# Patient Record
Sex: Male | Born: 1980 | Race: White | Hispanic: No | Marital: Married | Smoking: Never smoker
Health system: Southern US, Community
[De-identification: ages and names within clinical notes are randomized; demographics above are authoritative.]

## PROBLEM LIST (undated history)

## (undated) DIAGNOSIS — I10 Essential (primary) hypertension: Secondary | ICD-10-CM

## (undated) DIAGNOSIS — E785 Hyperlipidemia, unspecified: Secondary | ICD-10-CM

## (undated) DIAGNOSIS — F329 Major depressive disorder, single episode, unspecified: Secondary | ICD-10-CM

## (undated) DIAGNOSIS — F32A Depression, unspecified: Secondary | ICD-10-CM

## (undated) HISTORY — DX: Depression, unspecified: F32.A

## (undated) HISTORY — PX: TONSILLECTOMY: SUR1361

## (undated) HISTORY — DX: Major depressive disorder, single episode, unspecified: F32.9

---

## 2001-09-11 ENCOUNTER — Emergency Department (HOSPITAL_COMMUNITY): Admission: EM | Admit: 2001-09-11 | Discharge: 2001-09-11 | Payer: Self-pay | Admitting: Emergency Medicine

## 2003-10-04 ENCOUNTER — Emergency Department (HOSPITAL_COMMUNITY): Admission: EM | Admit: 2003-10-04 | Discharge: 2003-10-04 | Payer: Self-pay | Admitting: Emergency Medicine

## 2004-01-13 ENCOUNTER — Emergency Department (HOSPITAL_COMMUNITY): Admission: EM | Admit: 2004-01-13 | Discharge: 2004-01-13 | Payer: Self-pay

## 2004-01-15 ENCOUNTER — Emergency Department (HOSPITAL_COMMUNITY): Admission: EM | Admit: 2004-01-15 | Discharge: 2004-01-15 | Payer: Self-pay | Admitting: Emergency Medicine

## 2006-01-13 ENCOUNTER — Emergency Department (HOSPITAL_COMMUNITY): Admission: EM | Admit: 2006-01-13 | Discharge: 2006-01-13 | Payer: Self-pay | Admitting: Family Medicine

## 2006-07-05 ENCOUNTER — Ambulatory Visit (HOSPITAL_BASED_OUTPATIENT_CLINIC_OR_DEPARTMENT_OTHER): Admission: RE | Admit: 2006-07-05 | Discharge: 2006-07-05 | Payer: Self-pay | Admitting: Family Medicine

## 2006-07-12 ENCOUNTER — Ambulatory Visit: Payer: Self-pay | Admitting: Internal Medicine

## 2007-02-14 ENCOUNTER — Emergency Department (HOSPITAL_COMMUNITY): Admission: EM | Admit: 2007-02-14 | Discharge: 2007-02-14 | Payer: Self-pay | Admitting: Emergency Medicine
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
## 2011-01-03 NOTE — Procedures (Signed)
NAME:  Devon Collins, CAMILLI NO.:  [id number]   MEDICAL RECORD NO.:  [id number]          PATIENT TYPE:  OUT   LOCATION:  SLEEP CENTER                 FACILITY:  [facility]   PHYSICIAN:  Clinton D. Maple Hudson, MD, FCCP, FACPDATE OF BIRTH:  12/09/1979   DATE OF STUDY:  07/05/2006                              NOCTURNAL POLYSOMNOGRAM   REFERRING PHYSICIAN:  Dr. Herb Grays   INDICATION FOR STUDY:  Hypersomnia with sleep apnea.   EPWORTH SLEEPINESS SCORE:  18/24. BMI 31, weight 218 pounds.   MEDICATIONS:  Lisinopril, fluvoxamine, Lamictal.   SLEEP ARCHITECTURE:  Total sleep time 341 minutes with sleep efficiency 76%.  Stage I was 8%, stage II 68%, stages III and IV 13%. REM 10% of total sleep  time. Sleep latency 105 minutes. REM latency 310 minutes. Awake after sleep  onset 15 minutes. Arousal index 16. No bedtime medication was taken.   RESPIRATORY DATA:  Apnea-hypopnea index (AHI, RDI) 3.7 obstructive events  per hour which is within normal limits (normal adult range 0-5 per hour).  There were 6 obstructive apneas and 15 hypopneas. Events were strongly  positional, mostly associated with the supine sleep position. REM AHI  16.9/hour. there were insufficient events to permit use of CPAP titration by  split protocol on this study night.   OXYGEN DATA:  Mild to moderate snoring and mouth-breathing with oxygen  desaturation to a nadir of 83%. Mean oxygen saturation through the study was  97% on room air.   CARDIAC DATA:  Normal sinus rhythm.   MOVEMENT-PARASOMNIA:  A total of 109 limb jerks were recorded of which 39  were associated with arousal or awakening for a period limb movement with  arousal index of 6.9/hour, which is increased.   IMPRESSIONS-RECOMMENDATIONS:  1. Sleep architecture significant mainly for delayed onset of sustained      sleep at around midnight. Percentage of time spent in rapid eye      movement (REM) was reduced but this is nonspecific.  2.  Occasional sleep disordered breathing events, within normal limits,      apnea-hypopnea index 3.7 per hour (normal range 0-5 per hour). Events      were positional, suggesting that the patient could be encouraged to      sleep off flat of her back as a way to reduce snoring and sleep-related      breathing complaints. There was mild to      moderate snoring with oxygen desaturation to a nadir of 83%.  3. Periodic limb movement with arousal, 6.9 per hour.      Clinton D. Maple Hudson, MD, [facility], FACP  Diplomate, Biomedical engineer of Sleep Medicine  Electronically Signed     CDY/MEDQ  D:  07/12/2006 16:25:37  T:  07/12/2006 16:52:24  Job:  [id number]

## 2012-12-20 ENCOUNTER — Encounter (HOSPITAL_COMMUNITY): Payer: Self-pay | Admitting: *Deleted

## 2012-12-20 ENCOUNTER — Emergency Department (INDEPENDENT_AMBULATORY_CARE_PROVIDER_SITE_OTHER)
Admission: EM | Admit: 2012-12-20 | Discharge: 2012-12-20 | Disposition: A | Discharge status: Home / Self Care | Payer: Worker's Compensation | Source: Home / Self Care

## 2012-12-20 DIAGNOSIS — T22119A Burn of first degree of unspecified forearm, initial encounter: Secondary | ICD-10-CM

## 2012-12-20 DIAGNOSIS — T22219A Burn of second degree of unspecified forearm, initial encounter: Secondary | ICD-10-CM

## 2012-12-20 MED ORDER — IBUPROFEN 800 MG PO TABS
ORAL_TABLET | ORAL | Status: AC
  Filled 2012-12-20: qty 1

## 2012-12-20 MED ORDER — IBUPROFEN 800 MG PO TABS
800.0000 mg | ORAL_TABLET | Freq: Once | ORAL | Status: AC
  Administered 2012-12-20: 800 mg via ORAL

## 2012-12-20 MED ORDER — SILVER SULFADIAZINE 1 % EX CREA: TOPICAL_CREAM | Freq: Every day | CUTANEOUS | Status: DC

## 2012-12-20 MED ORDER — SILVER SULFADIAZINE 1 % EX CREA
TOPICAL_CREAM | Freq: Once | CUTANEOUS | Status: AC
  Administered 2012-12-20: 20:00:00 via TOPICAL

## 2012-12-20 MED ORDER — IBUPROFEN 800 MG PO TABS: 800.0000 mg | ORAL_TABLET | Freq: Three times a day (TID) | ORAL | Status: DC
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
## 2012-12-20 NOTE — Discharge Instructions (Signed)
Burn Care Your skin is a natural barrier to infection. It is the largest organ of your body. Burns damage this natural protection. To help prevent infection, it is very important to follow your caregiver's instructions in the care of your burn. Burns are classified as:  First degree. There is only redness of the skin (erythema). No scarring is expected.  Second degree. There is blistering of the skin. Scarring may occur with deeper burns.  Third degree. All layers of the skin are injured, and scarring is expected. HOME CARE INSTRUCTIONS   Wash your hands well before changing your bandage.  Change your bandage as often as directed by your caregiver.  Remove the old bandage. If the bandage sticks, you may soak it off with cool, clean water.  Cleanse the burn thoroughly but gently with mild soap and water.  Pat the area dry with a clean, dry cloth.  Apply a thin layer of antibacterial cream to the burn.  Apply a clean bandage as instructed by your caregiver.  Keep the bandage as clean and dry as possible.  Elevate the affected area for the first 24 hours, then as instructed by your caregiver.  Only take over-the-counter or prescription medicines for pain, discomfort, or fever as directed by your caregiver. SEEK IMMEDIATE MEDICAL CARE IF:   You develop excessive pain.  You develop redness, tenderness, swelling, or red streaks near the burn.  The burned area develops yellowish-white fluid (pus) or a bad smell.  You have a fever. MAKE SURE YOU:   Understand these instructions.  Will watch your condition.  Will get help right away if you are not doing well or get worse. Document Released: 08/04/2005 Document Revised: 10/27/2011 Document Reviewed: 12/25/2010 [facility] Patient Information 2013 [place], [place].  Second-Degree Burn A second-degree burn affects the 2 outer layers of skin. The outer layer (epidermis) and the layer underneath it (dermis) are both burned. Another  name for this type of burn is a partial thickness burn. A second-degree burn may be called minor or major. This depends on the size of the burn. It also depends on what parts of the skin are burned. Minor burns may be treated with first aid. Major burns are a medical emergency. A second-degree burn is worse than a first-degree burn, but not as bad as a third-degree burn. A first-degree burn affects only the epidermis. A third-degree burn goes through all the layers of skin. A second-degree burn usually heals in 3 to 4 weeks. A minor second-degree burn usually does not leave a scar.Deeper second-degree burns may lead to scarring of the skin or contractures over joints.Contractures are scars that form over joints and may lead to reduced mobility at those joints. CAUSES  Heat (thermal) injury. This happens when skin comes in contact with something very hot. It could be a flame, a hot object, hot liquid, or steam. Most second-degree burns are thermal injuries.  Radiation. Sunlight is one type of radiation that can burn the skin. Another type of radiation is used to heat food. Radiation is also used to treat some diseases, such as cancer. All types of radiation can burn the skin. Sunlight usually causes a first-degree burn. Radiation used for heating food or treating a disease can cause a second-degree burn.  Electricity. Electrical burns can cause more damage under the skin than on the surface. They should always be treated as major burns.  Chemicals. Many chemicals can burn the skin. The burn should be flushed with cool water and checked  by an emergency caregiver. SYMPTOMS Symptoms of second-degree burns include:  Severe pain.  Extreme tenderness.  Deep redness.  Blistered skin.  Skin that has changed color.It might look blotchy, wet, or shiny.  Swelling. TREATMENT Some second-degree burns may need to be treated in a hospital. These include major burns, electrical burns, and chemical burns.  Many other second-degree burns can be treated with regular first aid, such as:  Cooling the burn. Use cool, germ-free (sterile) salt water. Place the burned area of skin into a tub of water, or cover the burned area with clean, wet towels.  Taking pain medicine.  Removing the dead skin from broken blisters. A trained caregiver may do this. Do not pop blisters.  Gently washing your skin with mild soap.  Covering the burned area with a cream.Silver sulfadiazine is a cream for burns. An antibiotic cream, such as bacitracin, may also be used to fight infection. Do not use other ointments or creams unless your caregiver says it is okay.  Protecting the burn with a sterile, non-sticky bandage.  Bandaging fingers and toes separately. This keeps them from sticking together.  Taking an antibiotic. This can help prevent infection.  Getting a tetanus shot. HOME CARE INSTRUCTIONS Medication  Take any medicine prescribed by your caregiver. Follow the directions carefully.  Ask your caregiver if you can take over-the-counter medicine to relieve pain and swelling. Do not give aspirin to children.  Make sure your caregiver knows about all other medicines you take.This includes over-the-counter medicines. Burn care  You will need to change the bandage on your burn. You may need to do this 2 or 3 times each day.  Gently clean the burned area.  Put ointment on it.  Cover the burn with a sterile bandage.  For some deeper burns or burns that cover a large area, compression garments may be prescribed. These garments can help minimize scarring and protect your mobility.  Do not put butter or oil on your skin. Use only the cream prescribed by your caregiver.  Do not put ice on your burn.  Do not break blisters on your skin.  Keep the bandaged area dry. You might need to take a sponge bath for awhile.Ask your caregiver when you can take a shower or a tub bath again.  Do not scratch an itchy  burn. Your caregiver may give you medicine to relieve very bad itching.  Infection is a big danger after a second-degree burn. Tell your caregiver right away if you have signs of infection, such as:  Redness or changing color in the burned area.  Fluid leaking from the burn.  Swelling in the burn area.  A bad smell coming from the wound. Follow-up  Keep all follow-up appointments.This is important. This is how your caregiver can tell if your treatment is working.  Protect your burn from sunlight.Use sunscreen whenever you go outside.Burned areas may be sensitive to the sun for up to 1 year. Exposure to the sun may also cause permanent darkening of scars. SEEK MEDICAL CARE IF:  You have any questions about medicines.  You have any questions about your treatment.  You wonder if it is okay to do a particular activity.  You develop a fever of more than 100.5 F (38.1 C). SEEK IMMEDIATE MEDICAL CARE IF:  You think your burn might be infected. It may change color, become red, leak fluid, swell, or smell bad.  You develop a fever of more than 102 F (38.9 C). Document Released:  01/06/2011 Document Revised: 10/27/2011 Document Reviewed: 01/06/2011 [facility] Patient Information 2013 [place], [place].
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
## 2012-12-24 NOTE — ED Provider Notes (Signed)
Medical screening examination/treatment/procedure(s) were performed by resident physician or non-physician practitioner and as supervising physician I was immediately available for consultation/collaboration.   Sherly Brodbeck DOUGLAS MD.   Nevena Rozenberg D Kaire Stary, MD 12/24/12 1804 

## 2014-03-21 ENCOUNTER — Other Ambulatory Visit: Payer: Self-pay | Admitting: Occupational Medicine

## 2014-03-21 ENCOUNTER — Ambulatory Visit
Admission: RE | Admit: 2014-03-21 | Discharge: 2014-03-21 | Disposition: A | Discharge status: Home / Self Care | Payer: No Typology Code available for payment source | Source: Ambulatory Visit | Attending: Occupational Medicine | Admitting: Occupational Medicine

## 2014-03-21 DIAGNOSIS — Z0000 Encounter for general adult medical examination without abnormal findings: Secondary | ICD-10-CM

## 2015-01-16 ENCOUNTER — Other Ambulatory Visit: Payer: Self-pay | Admitting: Surgery

## 2015-02-28 ENCOUNTER — Ambulatory Visit: Payer: Self-pay | Admitting: Family

## 2015-02-28 ENCOUNTER — Encounter: Payer: Self-pay | Admitting: Physician Assistant

## 2015-02-28 ENCOUNTER — Ambulatory Visit (INDEPENDENT_AMBULATORY_CARE_PROVIDER_SITE_OTHER): Payer: BLUE CROSS/BLUE SHIELD | Admitting: Physician Assistant

## 2015-02-28 VITALS — BP 154/108 | HR 85 | Temp 97.6°F | Ht 61.0 in | Wt 242.4 lb

## 2015-02-28 DIAGNOSIS — I10 Essential (primary) hypertension: Secondary | ICD-10-CM

## 2015-02-28 MED ORDER — LISINOPRIL-HYDROCHLOROTHIAZIDE 10-12.5 MG PO TABS: 1.0000 | ORAL_TABLET | Freq: Every day | ORAL | Status: DC

## 2015-02-28 NOTE — Progress Notes (Signed)
   Subjective:    Patient ID: Devon Collins, male    DOB: 1981/07/06, 34 y.o.   MRN: [id number]  HPI 34 y/o male presents with c/o elevated BP. He has a h/o htn. Was being treated with Lisinopril/HCTZ , however, his PCP stopped the medication 6 months ago. .     Review of Systems  Constitutional: Negative.   HENT: Negative.   Eyes: Negative for photophobia and visual disturbance.  Respiratory: Negative for shortness of breath.   Cardiovascular: Negative for chest pain, palpitations and leg swelling.  Gastrointestinal: Negative for nausea and vomiting.  Genitourinary: Negative.  Negative for difficulty urinating.  Musculoskeletal: Negative.   Skin: Negative.   Neurological: Negative.   Psychiatric/Behavioral: Negative.        Objective:   Physical Exam  Constitutional: He is oriented to person, place, and time. He appears well-developed and well-nourished. No distress.  HENT:  Head: Normocephalic.  Cardiovascular: Normal rate, regular rhythm, normal heart sounds and intact distal pulses.  Exam reveals no gallop and no friction rub.   No murmur heard. Hypertensive   Pulmonary/Chest: Effort normal and breath sounds normal. No respiratory distress. He has no wheezes. He has no rales. He exhibits no tenderness.  Neurological: He is alert and oriented to person, place, and time.  Skin: He is not diaphoretic.  Psychiatric: He has a normal mood and affect. His behavior is normal. Judgment and thought content normal.  Nursing note and vitals reviewed.         Assessment & Plan:  1. Essential hypertension  - lisinopril-hydrochlorothiazide (PRINZIDE,ZESTORETIC) 10-12.5 MG per tablet; Take 1 tablet by mouth daily.  Dispense: 30 tablet; Refill: 0   Follow up in 2 weeks for recheck of htn, in addition to fasting labs. Chart given to patient for at home bp readings   Devon Collins A. Chauncey ReadingGann PA-C

## 2015-03-16 ENCOUNTER — Ambulatory Visit: Payer: BLUE CROSS/BLUE SHIELD | Admitting: Physician Assistant

## 2015-03-21 ENCOUNTER — Ambulatory Visit: Payer: BLUE CROSS/BLUE SHIELD | Admitting: Physician Assistant

## 2015-03-22 ENCOUNTER — Other Ambulatory Visit: Payer: Self-pay | Admitting: Physician Assistant

## 2015-03-22 DIAGNOSIS — I10 Essential (primary) hypertension: Secondary | ICD-10-CM

## 2015-03-22 MED ORDER — LISINOPRIL-HYDROCHLOROTHIAZIDE 10-12.5 MG PO TABS: 1.0000 | ORAL_TABLET | Freq: Every day | ORAL | Status: DC

## 2015-04-18 ENCOUNTER — Ambulatory Visit (INDEPENDENT_AMBULATORY_CARE_PROVIDER_SITE_OTHER): Payer: 59 | Admitting: Family

## 2015-04-18 ENCOUNTER — Encounter: Payer: Self-pay | Admitting: Family

## 2015-04-18 VITALS — BP 131/87 | HR 96 | Temp 97.3°F | Ht 70.0 in | Wt 231.0 lb

## 2015-04-18 DIAGNOSIS — Z0000 Encounter for general adult medical examination without abnormal findings: Secondary | ICD-10-CM | POA: Diagnosis not present

## 2015-04-18 DIAGNOSIS — F32A Depression, unspecified: Secondary | ICD-10-CM | POA: Insufficient documentation

## 2015-04-18 DIAGNOSIS — I10 Essential (primary) hypertension: Secondary | ICD-10-CM | POA: Diagnosis not present

## 2015-04-18 DIAGNOSIS — F329 Major depressive disorder, single episode, unspecified: Secondary | ICD-10-CM | POA: Diagnosis not present

## 2015-04-18 MED ORDER — DESVENLAFAXINE SUCCINATE ER 100 MG PO TB24: 100.0000 mg | ORAL_TABLET | Freq: Every day | ORAL | Status: DC

## 2015-04-18 MED ORDER — LISINOPRIL-HYDROCHLOROTHIAZIDE 10-12.5 MG PO TABS: 1.0000 | ORAL_TABLET | Freq: Every day | ORAL | Status: DC

## 2015-04-18 NOTE — Patient Instructions (Signed)

## 2015-04-18 NOTE — Progress Notes (Signed)
Subjective:    Patient ID: Devon Collins, male    DOB: [birth year], 34 y.o.   MRN: [id number]  Pt presents to the office today for CPE and lab work. PT states he is fasting.  Hypertension This is a chronic problem. The current episode started more than 1 year ago. The problem has been resolved since onset. The problem is controlled. Pertinent negatives include no anxiety, headaches, palpitations, peripheral edema or shortness of breath. Risk factors for coronary artery disease include obesity and male gender. Past treatments include ACE inhibitors and diuretics. The current treatment provides significant improvement. There is no history of kidney disease, CAD/MI, CVA, heart failure or a thyroid problem. There is no history of sleep apnea.  Depression        This is a chronic problem.  The current episode started more than 1 year ago.   The onset quality is sudden.   The problem occurs constantly.  The problem has been waxing and waning since onset.  Associated symptoms include no fatigue, no hopelessness, no restlessness, no headaches and not sad.  Compliance with treatment is good.  Previous treatment provided significant relief.   Pertinent negatives include no thyroid problem and no anxiety.     Review of Systems  Constitutional: Negative.  Negative for fatigue.  HENT: Negative.   Respiratory: Negative.  Negative for shortness of breath.   Cardiovascular: Negative.  Negative for palpitations.  Gastrointestinal: Negative.   Endocrine: Negative.   Genitourinary: Negative.   Musculoskeletal: Negative.   Neurological: Negative.  Negative for headaches.  Hematological: Negative.   Psychiatric/Behavioral: Positive for depression.  All other systems reviewed and are negative.      Objective:   Physical Exam  Constitutional: He is oriented to person, place, and time. He appears well-developed and well-nourished. No distress.  HENT:  Head: Normocephalic.  Right Ear: External ear  normal.  Left Ear: External ear normal.  Nose: Nose normal.  Mouth/Throat: Oropharynx is clear and moist.  Eyes: Pupils are equal, round, and reactive to light. Right eye exhibits no discharge. Left eye exhibits no discharge.  Neck: Normal range of motion. Neck supple. No thyromegaly present.  Cardiovascular: Normal rate, regular rhythm, normal heart sounds and intact distal pulses.   No murmur heard. Pulmonary/Chest: Effort normal and breath sounds normal. No respiratory distress. He has no wheezes.  Abdominal: Soft. Bowel sounds are normal. He exhibits no distension. There is no tenderness.  Musculoskeletal: Normal range of motion. He exhibits no edema or tenderness.  Neurological: He is alert and oriented to person, place, and time. He has normal reflexes. No cranial nerve deficit.  Skin: Skin is warm and dry. No rash noted. No erythema.  Psychiatric: He has a normal mood and affect. His behavior is normal. Judgment and thought content normal.  Vitals reviewed.   BP 131/87 mmHg  Pulse 96  Temp(Src) 97.3 F (36.3 C) (Oral)  Ht 5' 10"  (1.778 m)  Wt 231 lb (104.781 kg)  BMI 33.15 kg/m2       Assessment & Plan:  1. Essential hypertension - CMP14+EGFR - lisinopril-hydrochlorothiazide (PRINZIDE,ZESTORETIC) 10-12.5 MG per tablet; Take 1 tablet by mouth daily.  Dispense: 90 tablet; Refill: 3  2. Depression - CMP14+EGFR - desvenlafaxine (PRISTIQ) 100 MG 24 hr tablet; Take 1 tablet (100 mg total) by mouth daily.  Dispense: 90 tablet; Refill: 3  3. Annual physical exam - CMP14+EGFR - Lipid panel - Thyroid Panel With TSH - Vit D  25 hydroxy (rtn osteoporosis  monitoring) - CBC with Differential/Platelet   Continue all meds Labs pending Health Maintenance reviewed Diet and exercise encouraged RTO 1 year   Evelina Dun, FNP

## 2015-04-19 ENCOUNTER — Other Ambulatory Visit: Payer: Self-pay | Admitting: Family

## 2015-04-19 DIAGNOSIS — E785 Hyperlipidemia, unspecified: Secondary | ICD-10-CM | POA: Insufficient documentation

## 2015-04-19 LAB — CMP14+EGFR
ALBUMIN: 4.9 g/dL (ref 3.5–5.5)
ALT: 24 IU/L (ref 0–44)
AST: 19 IU/L (ref 0–40)
Albumin/Globulin Ratio: 2 (ref 1.1–2.5)
Alkaline Phosphatase: 71 IU/L (ref 39–117)
BUN / CREAT RATIO: 17 (ref 8–19)
BUN: 19 mg/dL (ref 6–20)
Bilirubin Total: 0.7 mg/dL (ref 0.0–1.2)
CALCIUM: 9.7 mg/dL (ref 8.7–10.2)
CO2: 25 mmol/L (ref 18–29)
CREATININE: 1.11 mg/dL (ref 0.76–1.27)
Chloride: 94 mmol/L — ABNORMAL LOW (ref 97–108)
GFR, EST AFRICAN AMERICAN: 99 mL/min/{1.73_m2} (ref >59)
GFR, EST NON AFRICAN AMERICAN: 86 mL/min/{1.73_m2} (ref >59)
GLOBULIN, TOTAL: 2.4 g/dL (ref 1.5–4.5)
Glucose: 87 mg/dL (ref 65–99)
Potassium: 4.3 mmol/L (ref 3.5–5.2)
SODIUM: 138 mmol/L (ref 134–144)
TOTAL PROTEIN: 7.3 g/dL (ref 6.0–8.5)

## 2015-04-19 LAB — LIPID PANEL
CHOL/HDL RATIO: 5.3 ratio — AB (ref 0.0–5.0)
Cholesterol, Total: 196 mg/dL (ref 100–199)
HDL: 37 mg/dL — ABNORMAL LOW (ref >39)
LDL CALC: 128 mg/dL — AB (ref 0–99)
TRIGLYCERIDES: 155 mg/dL — AB (ref 0–149)
VLDL Cholesterol Cal: 31 mg/dL (ref 5–40)

## 2015-04-19 LAB — CBC WITH DIFFERENTIAL/PLATELET
BASOS: 0 %
Basophils Absolute: 0 10*3/uL (ref 0.0–0.2)
EOS (ABSOLUTE): 0.2 10*3/uL (ref 0.0–0.4)
EOS: 3 %
HEMATOCRIT: 42.5 % (ref 37.5–51.0)
Hemoglobin: 14.8 g/dL (ref 12.6–17.7)
IMMATURE GRANULOCYTES: 0 %
Immature Grans (Abs): 0 10*3/uL (ref 0.0–0.1)
Lymphocytes Absolute: 2.3 10*3/uL (ref 0.7–3.1)
Lymphs: 27 %
MCH: 29.9 pg (ref 26.6–33.0)
MCHC: 34.8 g/dL (ref 31.5–35.7)
MCV: 86 fL (ref 79–97)
MONOS ABS: 0.8 10*3/uL (ref 0.1–0.9)
Monocytes: 9 %
NEUTROS PCT: 61 %
Neutrophils Absolute: 5.1 10*3/uL (ref 1.4–7.0)
Platelets: 250 10*3/uL (ref 150–379)
RBC: 4.95 x10E6/uL (ref 4.14–5.80)
RDW: 13.2 % (ref 12.3–15.4)
WBC: 8.4 10*3/uL (ref 3.4–10.8)

## 2015-04-19 LAB — THYROID PANEL WITH TSH
Free Thyroxine Index: 2.3 (ref 1.2–4.9)
T3 UPTAKE RATIO: 28 % (ref 24–39)
T4 TOTAL: 8.2 ug/dL (ref 4.5–12.0)
TSH: 1.87 u[IU]/mL (ref 0.450–4.500)

## 2015-04-19 LAB — VITAMIN D 25 HYDROXY (VIT D DEFICIENCY, FRACTURES): Vit D, 25-Hydroxy: 41.8 ng/mL (ref 30.0–100.0)

## 2015-04-19 MED ORDER — SIMVASTATIN 20 MG PO TABS
20.0000 mg | ORAL_TABLET | Freq: Every day | ORAL | Status: DC
Start: 2015-04-19 — End: 2016-04-23

## 2015-05-19 ENCOUNTER — Other Ambulatory Visit: Payer: Self-pay | Admitting: Physician Assistant

## 2015-05-26 ENCOUNTER — Telehealth: Payer: 59 | Admitting: Family

## 2015-05-26 DIAGNOSIS — R6889 Other general symptoms and signs: Secondary | ICD-10-CM

## 2015-05-26 MED ORDER — OSELTAMIVIR PHOSPHATE 75 MG PO CAPS: 75.0000 mg | ORAL_CAPSULE | Freq: Two times a day (BID) | ORAL | Status: DC

## 2015-05-26 NOTE — Progress Notes (Signed)

## 2015-06-12 ENCOUNTER — Encounter: Payer: Self-pay | Admitting: Physician Assistant

## 2015-08-26 ENCOUNTER — Telehealth: Payer: 59 | Admitting: Family

## 2015-08-26 DIAGNOSIS — L700 Acne vulgaris: Secondary | ICD-10-CM | POA: Diagnosis not present

## 2015-08-26 MED ORDER — DOXYCYCLINE HYCLATE 100 MG PO TABS: 100.0000 mg | ORAL_TABLET | Freq: Two times a day (BID) | ORAL | Status: DC

## 2015-08-26 MED ORDER — CLINDAMYCIN PHOS-BENZOYL PEROX 1-5 % EX GEL: Freq: Two times a day (BID) | CUTANEOUS | Status: DC

## 2015-08-26 NOTE — Progress Notes (Signed)
We are sorry that you are experiencing this issue.  Here is how we plan to help!  Based on what you shared with me it looks like you have cystic acne.  Acne is a disorder of the hair follicles and oil glands (sebaceous glands). The sebaceous glands secrete oils to keep the skin moist.  When the glands get clogged, it can lead to pimples or cysts.  These cysts may become infected and leave scars. Acne is very common and normally occurs at puberty.  Acne is also inherited.  Your personal care plan consists of the following recommendations:  I recommend that you use a daily cleanser  You might try an over the counter cleanser that has benzoyl peroxide.  I recommend that you start with a product that has 2.5% benzoyl peroxide.  Stronger concentrations have not been shown to be more effective.  I have prescribed a topical gel with an antibiotic:  Clindamycin-benzoyl peroxide gel.  This gel should be applied to the affected areas twice a day.  Be sure to read the package insert to understand potential side effects.  I have also prescribed one of the following additional therapies:  Doxycycline an oral antibiotic 100 mg twice a day  If excessive dryness or peeling occurs, reduce dose frequency or concentration of the topical scrubs.  If excessive stinging or burning occurs, remove the topical gel with mild soap and water and resume at a lower dose the next day.  Remember oral antibiotics and topical acne treatments may increase your sensitivity to the sun!  HOME CARE:  Do not squeeze pimples because that can often lead to infections, worse acne, and scars.  Use a moisturizer that contains retinoid or fruit acids that may inhibit the development of new acne lesions.  Although there is not a clear link that foods can cause acne, doctors do believe that too many sweets predispose you to skin problems.  GET HELP RIGHT AWAY IF:  If your acne gets worse or is not better within 10 days.  If you  become depressed.  If you become pregnant, discontinue medications and call your OB/GYN.  MAKE SURE YOU:  Understand these instructions.  Will watch your condition.  Will get help right away if you are not doing well or get worse.   Your e-visit answers were reviewed by a board certified advanced clinical practitioner to complete your personal care plan.  Depending upon the condition, your plan could have included both over the counter or prescription medications.  Please review your pharmacy choice.  If there is a problem, you may contact your provider through MyChart messaging and have the prescription routed to another pharmacy.  Your safety is important to us.  If you have drug allergies check your prescription carefully.  For the next 24 hours you can use MyChart to ask questions about today's visit, request a non-urgent call back, or ask for a work or school excuse from your e-visit provider.  You will get an email in the next two days asking about your experience. I hope that your e-visit has been valuable and will speed your recovery.   

## 2015-10-15 MED FILL — SIMVASTATIN 20 MG TABLET: 20 | 90 days supply | Qty: 90 | Fill #2

## 2015-10-15 MED FILL — PRISTIQ ER 100 MG TABLET: 100 | 90 days supply | Qty: 90 | Fill #2

## 2015-10-15 MED FILL — LISINOPRIL-HCTZ 10-12.5 MG: 10-12.5 | 90 days supply | Qty: 90 | Fill #2

## 2015-11-23 IMAGING — CR DG CHEST 2V
2 series · 2 of 2 positions shown · non-contrast
Comparison: None

CLINICAL DATA: Exit physical examination.

EXAM:
CHEST - 2 VIEW

[view not recorded (1 of 2)]
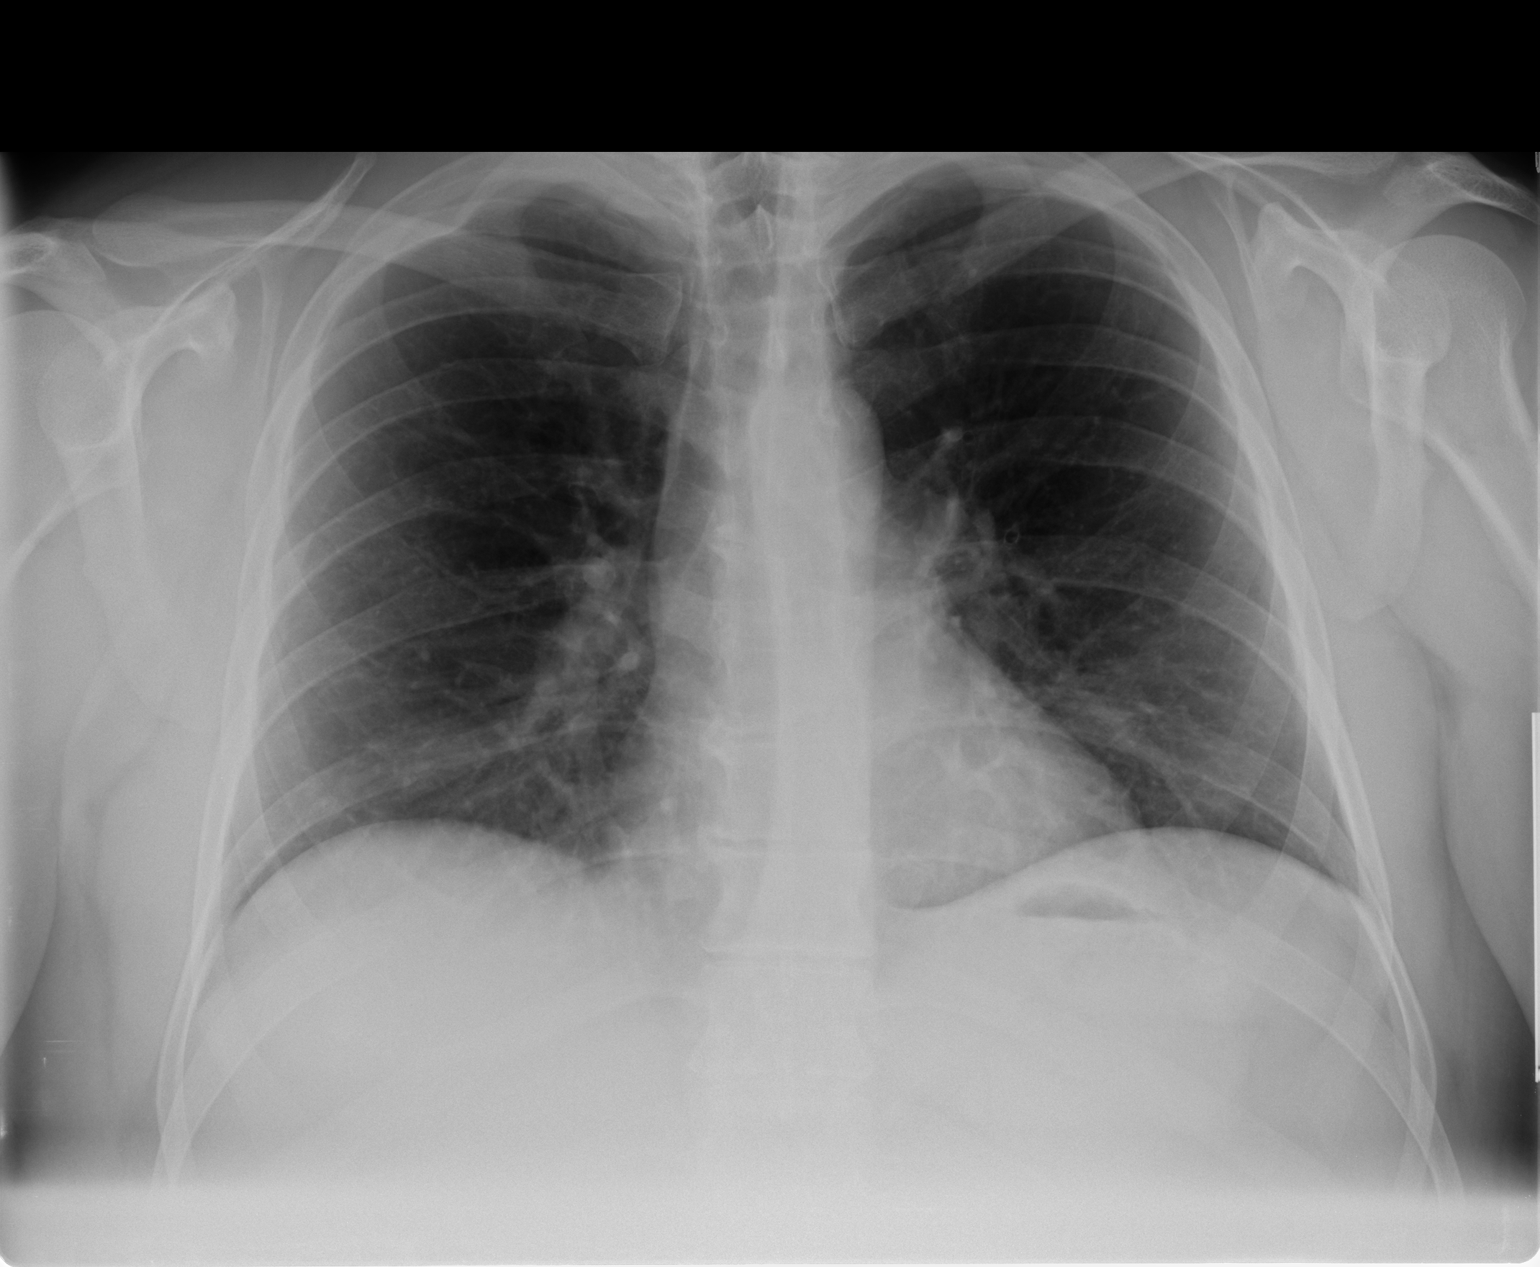

[view not recorded (2 of 2)]
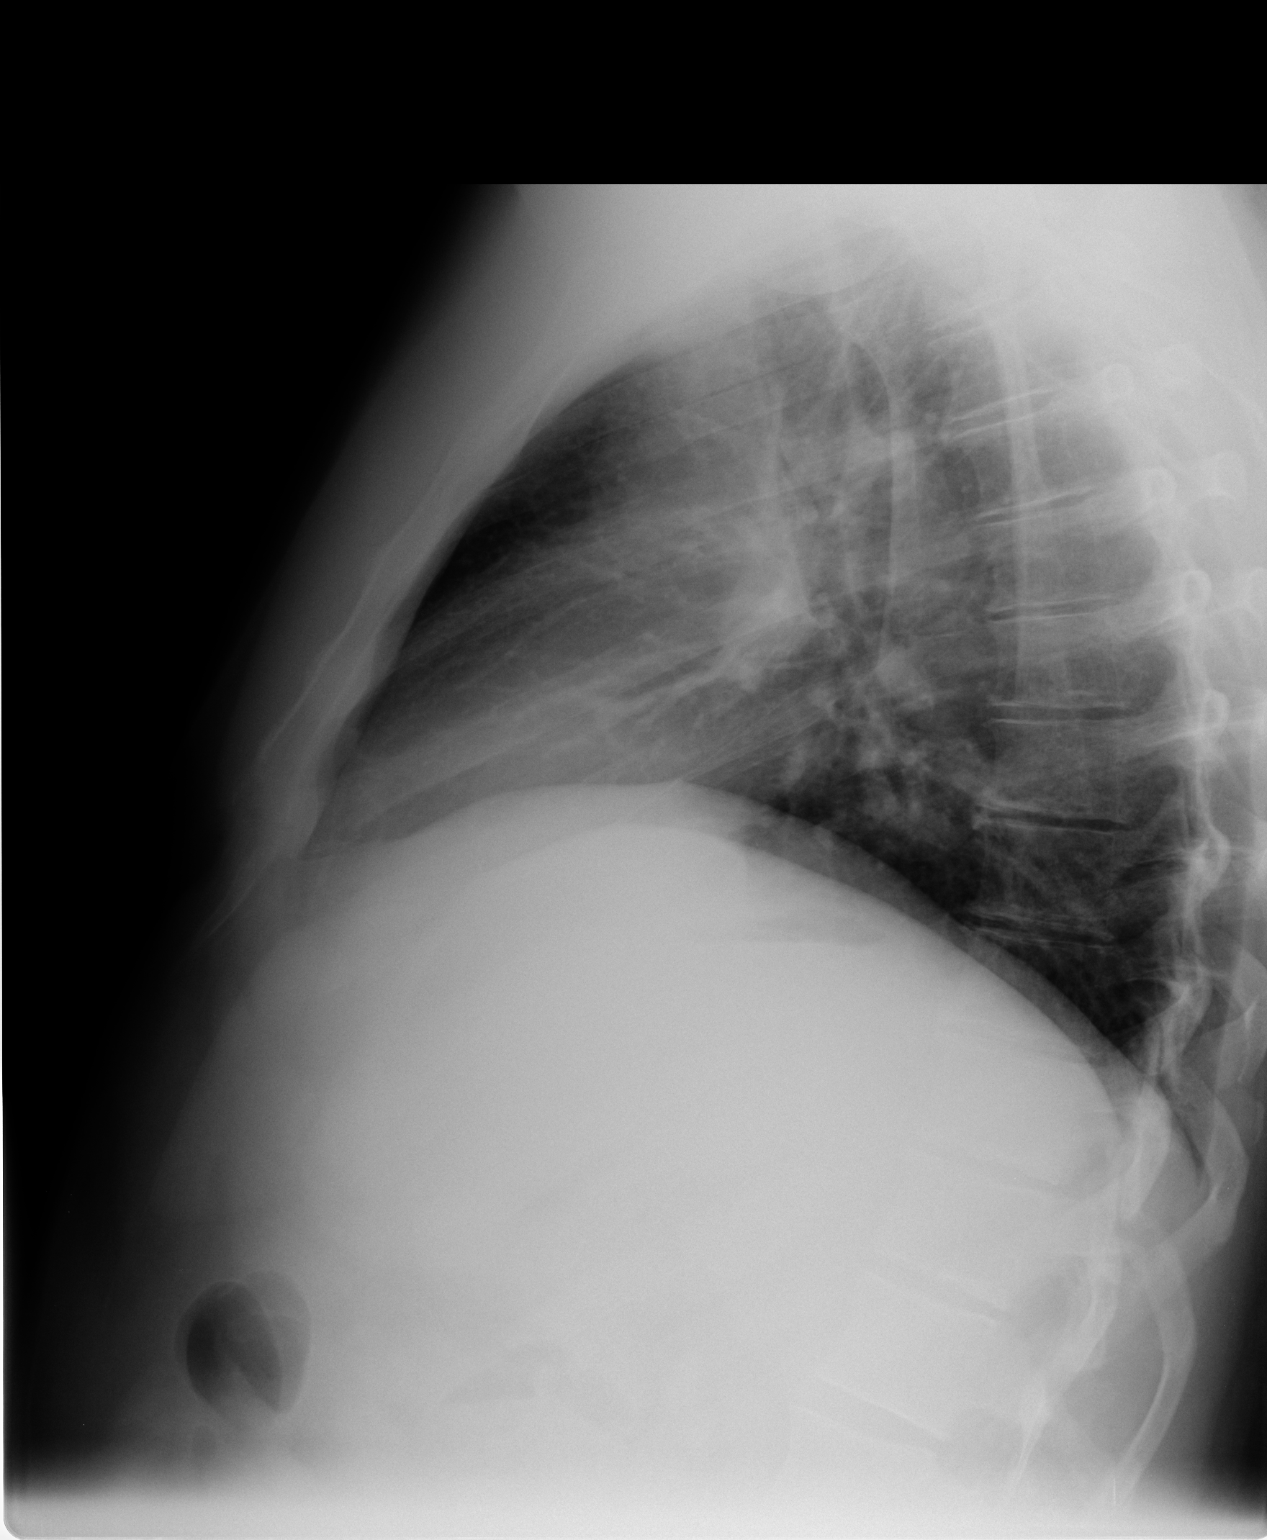

[2 of 2 positions shown; findings below may reference images not displayed]

FINDINGS: The heart size and mediastinal contours are within normal limits.
Lung volumes are relatively low bilaterally. There is no evidence of
pulmonary edema, consolidation, pneumothorax, nodule or pleural
fluid. The visualized skeletal structures are unremarkable.
IMPRESSION: No active disease.

## 2016-01-21 MED FILL — DESVENLAFAXINE SUC ER 100 M: 100 | 90 days supply | Qty: 90 | Fill #3

## 2016-01-21 MED FILL — LISINOPRIL-HCTZ 10-12.5 MG: 10-12.5 | 90 days supply | Qty: 90 | Fill #3

## 2016-01-21 MED FILL — SIMVASTATIN 20 MG TABLET: 20 | 90 days supply | Qty: 90 | Fill #3

## 2016-01-24 ENCOUNTER — Other Ambulatory Visit: Payer: Self-pay | Admitting: Surgery

## 2016-01-24 DIAGNOSIS — L0591 Pilonidal cyst without abscess: Secondary | ICD-10-CM | POA: Diagnosis not present

## 2016-02-18 NOTE — Pre-Procedure Instructions (Signed)
Devon PilotFrederick P Collins  02/18/2016     Your procedure is scheduled on : Monday February 25, 2016 at 12:20 PM.  [facility] at 10:20 AM.  Call this number if you have problems the morning of surgery: [phone]    Remember:  Do not eat food or drink liquids after midnight.  Take these medicines the morning of surgery with A SIP OF WATER : Desvenlafaxine (Pristiq)   Stop taking any vitamins, herbal medications/supplements, NSAIDs, Ibuprofen, Advil, Motrin, Aleve, etc today   Do not wear jewelry.  Do not wear lotions, powders, or cologne.    Men may shave face and neck.  Do not bring valuables to the hospital.  [facility] is not responsible for any belongings or valuables.  Contacts, dentures or bridgework may not be worn into surgery.  Leave your suitcase in the car.  After surgery it may be brought to your room.  For patients admitted to the hospital, discharge time will be determined by your treatment team.  Patients discharged the day of surgery will not be allowed to drive home.   Name and phone number of your driver:    Special instructions:  Shower using CHG soap the night before and the morning of your surgery  Please read over the following fact sheets that you were given.

## 2016-02-20 ENCOUNTER — Encounter (HOSPITAL_COMMUNITY): Payer: Self-pay

## 2016-02-20 ENCOUNTER — Encounter (HOSPITAL_COMMUNITY)
Admission: RE | Admit: 2016-02-20 | Discharge: 2016-02-20 | Disposition: A | Discharge status: Home / Self Care | Payer: 59 | Source: Ambulatory Visit | Attending: Surgery | Admitting: Surgery

## 2016-02-20 DIAGNOSIS — L0591 Pilonidal cyst without abscess: Secondary | ICD-10-CM | POA: Insufficient documentation

## 2016-02-20 DIAGNOSIS — I10 Essential (primary) hypertension: Secondary | ICD-10-CM | POA: Diagnosis not present

## 2016-02-20 DIAGNOSIS — Z01812 Encounter for preprocedural laboratory examination: Secondary | ICD-10-CM | POA: Diagnosis not present

## 2016-02-20 DIAGNOSIS — Z01818 Encounter for other preprocedural examination: Secondary | ICD-10-CM | POA: Diagnosis not present

## 2016-02-20 HISTORY — DX: Essential (primary) hypertension: I10

## 2016-02-20 HISTORY — DX: Hyperlipidemia, unspecified: E78.5

## 2016-02-20 LAB — BASIC METABOLIC PANEL
Anion gap: 7 (ref 5–15)
BUN: 14 mg/dL (ref 6–20)
CALCIUM: 9.5 mg/dL (ref 8.9–10.3)
CHLORIDE: 102 mmol/L (ref 101–111)
CO2: 28 mmol/L (ref 22–32)
CREATININE: 1.11 mg/dL (ref 0.61–1.24)
GFR calc Af Amer: >60 mL/min (ref >60)
GFR calc non Af Amer: >60 mL/min (ref >60)
GLUCOSE: 96 mg/dL (ref 65–99)
Potassium: 4 mmol/L (ref 3.5–5.1)
Sodium: 137 mmol/L (ref 135–145)

## 2016-02-20 LAB — CBC
HEMATOCRIT: 42.1 % (ref 39.0–52.0)
HEMOGLOBIN: 14.6 g/dL (ref 13.0–17.0)
MCH: 30.2 pg (ref 26.0–34.0)
MCHC: 34.7 g/dL (ref 30.0–36.0)
MCV: 87.2 fL (ref 78.0–100.0)
Platelets: 211 10*3/uL (ref 150–400)
RBC: 4.83 MIL/uL (ref 4.22–5.81)
RDW: 12.2 % (ref 11.5–15.5)
WBC: 6.1 10*3/uL (ref 4.0–10.5)

## 2016-02-20 NOTE — Progress Notes (Signed)
PCP is Christy A. Hawks-FNP  Patient denied having any acute cardiac or pulmonary issues

## 2016-02-22 MED ORDER — CEFAZOLIN SODIUM-DEXTROSE 2-4 GM/100ML-% IV SOLN
2.0000 g | INTRAVENOUS | Status: AC
  Administered 2016-02-25: 2 g via INTRAVENOUS
  Filled 2016-02-22: qty 100

## 2016-02-24 NOTE — H&P (Signed)
  Devon Collins  Location: [facility] Patient #: [id number] DOB: 1981/01/26 Married / Language: English / Race: White Male   History of Present Illness  The patient is a 35 year old male who presents with a pilonidal cyst. I saw this gentleman approximately one year ago with a very tiny pilonidal cyst. He had had 1 incision and drainage procedure approximately 8 years prior to that. We decided to hold on definitive surgery. He recently had a recurrent infection in the same area which drained on its own. He is now interested in surgery. Currently he is doing well and is pain-free.  PMHx/PSHx:  Unremarkable  Allergies  No Known Drug Allergies05/31/2016  Medication History  Pristiq (100MG  Tablet ER 24HR, Oral) Active. Ibuprofen (800MG  Tablet, Oral AS needed) Active. Medications Reconciled  ROS:  Neg for chest pain or SOB Vitals   Weight: 230 lb Height: 70in Body Surface Area: 2.22 m Body Mass Index: 33 kg/m  Temp.: 97.37F(Temporal)  Pulse: 102 (Regular)  BP: 130/78 (Sitting, Left Arm, Standard)    Physical Exam  The physical exam findings are as follows: Note:On examination, there is a small scar at the top of the gluteal cleft. I cannot express any purulence and there is no erythema Lungs clear CV RRR Abd soft, NT/ND o E/C/C    Assessment & Plan   PILONIDAL CYST (L05.91)  Impression: At this point, he is interested in definitive surgery to prevent ongoing infections. I discussed the surgical procedure with him in detail. I discussed the risk which includes but is not limited to bleeding, infection, recurrence, having a chronic open wound, etc. He understands and wished to proceed with pilonidal cystectomy

## 2016-02-25 ENCOUNTER — Ambulatory Visit (HOSPITAL_COMMUNITY): Payer: 59 | Admitting: Certified Registered Nurse Anesthetist

## 2016-02-25 ENCOUNTER — Encounter (HOSPITAL_COMMUNITY): Payer: Self-pay | Admitting: *Deleted

## 2016-02-25 ENCOUNTER — Ambulatory Visit (HOSPITAL_COMMUNITY)
Admission: RE | Admit: 2016-02-25 | Discharge: 2016-02-25 | Disposition: A | Discharge status: Home / Self Care | Payer: 59 | Source: Ambulatory Visit | Attending: Surgery | Admitting: Surgery

## 2016-02-25 ENCOUNTER — Encounter (HOSPITAL_COMMUNITY)
Admission: RE | Disposition: A | Discharge status: Home / Self Care | Payer: Self-pay | Source: Ambulatory Visit | Attending: Surgery

## 2016-02-25 DIAGNOSIS — Z79899 Other long term (current) drug therapy: Secondary | ICD-10-CM | POA: Insufficient documentation

## 2016-02-25 DIAGNOSIS — I10 Essential (primary) hypertension: Secondary | ICD-10-CM | POA: Diagnosis not present

## 2016-02-25 DIAGNOSIS — F329 Major depressive disorder, single episode, unspecified: Secondary | ICD-10-CM | POA: Diagnosis not present

## 2016-02-25 DIAGNOSIS — L0591 Pilonidal cyst without abscess: Secondary | ICD-10-CM | POA: Diagnosis not present

## 2016-02-25 HISTORY — PX: PILONIDAL CYST EXCISION: SHX744

## 2016-02-25 SURGERY — EXCISION, PILONIDAL CYST, EXTENSIVE
Anesthesia: General | Site: Buttocks

## 2016-02-25 MED ORDER — ONDANSETRON HCL 4 MG/2ML IJ SOLN
INTRAMUSCULAR | Status: DC | PRN
  Administered 2016-02-25: 4 mg via INTRAVENOUS

## 2016-02-25 MED ORDER — MIDAZOLAM HCL 5 MG/5ML IJ SOLN
INTRAMUSCULAR | Status: DC | PRN
  Administered 2016-02-25 (×2): 1 mg via INTRAVENOUS

## 2016-02-25 MED ORDER — OXYCODONE-ACETAMINOPHEN 5-325 MG PO TABS: 1.0000 | ORAL_TABLET | ORAL | Status: DC | PRN

## 2016-02-25 MED ORDER — BUPIVACAINE-EPINEPHRINE (PF) 0.5% -1:200000 IJ SOLN
INTRAMUSCULAR | Status: DC | PRN
  Administered 2016-02-25: 20 mL

## 2016-02-25 MED ORDER — LIDOCAINE HCL (CARDIAC) 20 MG/ML IV SOLN
INTRAVENOUS | Status: DC | PRN
  Administered 2016-02-25: 100 mg via INTRAVENOUS

## 2016-02-25 MED ORDER — LACTATED RINGERS IV SOLN
INTRAVENOUS | Status: DC
  Administered 2016-02-25 (×2): via INTRAVENOUS

## 2016-02-25 MED ORDER — MEPERIDINE HCL 25 MG/ML IJ SOLN: 6.2500 mg | INTRAMUSCULAR | Status: DC | PRN

## 2016-02-25 MED ORDER — FENTANYL CITRATE (PF) 250 MCG/5ML IJ SOLN
INTRAMUSCULAR | Status: AC
  Filled 2016-02-25: qty 5

## 2016-02-25 MED ORDER — ROCURONIUM BROMIDE 50 MG/5ML IV SOLN
INTRAVENOUS | Status: AC
  Filled 2016-02-25: qty 1

## 2016-02-25 MED ORDER — ROCURONIUM BROMIDE 100 MG/10ML IV SOLN
INTRAVENOUS | Status: DC | PRN
  Administered 2016-02-25: 40 mg via INTRAVENOUS

## 2016-02-25 MED ORDER — DOXYCYCLINE HYCLATE 100 MG PO TABS: 100.0000 mg | ORAL_TABLET | Freq: Two times a day (BID) | ORAL | Status: DC

## 2016-02-25 MED ORDER — SUGAMMADEX SODIUM 200 MG/2ML IV SOLN
INTRAVENOUS | Status: AC
  Filled 2016-02-25: qty 2

## 2016-02-25 MED ORDER — ONDANSETRON HCL 4 MG/2ML IJ SOLN
INTRAMUSCULAR | Status: AC
  Filled 2016-02-25: qty 2

## 2016-02-25 MED ORDER — PROPOFOL 10 MG/ML IV BOLUS
INTRAVENOUS | Status: DC | PRN
  Administered 2016-02-25: 20 mg via INTRAVENOUS
  Administered 2016-02-25: 150 mg via INTRAVENOUS

## 2016-02-25 MED ORDER — LIDOCAINE 2% (20 MG/ML) 5 ML SYRINGE
INTRAMUSCULAR | Status: AC
  Filled 2016-02-25: qty 5

## 2016-02-25 MED ORDER — HYDROMORPHONE HCL 1 MG/ML IJ SOLN: 0.2500 mg | INTRAMUSCULAR | Status: DC | PRN

## 2016-02-25 MED ORDER — ARTIFICIAL TEARS OP OINT
TOPICAL_OINTMENT | OPHTHALMIC | Status: AC
  Filled 2016-02-25: qty 3.5

## 2016-02-25 MED ORDER — ONDANSETRON HCL 4 MG/2ML IJ SOLN: 4.0000 mg | Freq: Once | INTRAMUSCULAR | Status: DC | PRN

## 2016-02-25 MED ORDER — PROPOFOL 10 MG/ML IV BOLUS
INTRAVENOUS | Status: AC
  Filled 2016-02-25: qty 40

## 2016-02-25 MED ORDER — FENTANYL CITRATE (PF) 100 MCG/2ML IJ SOLN
INTRAMUSCULAR | Status: DC | PRN
  Administered 2016-02-25: 100 ug via INTRAVENOUS
  Administered 2016-02-25: 50 ug via INTRAVENOUS
  Administered 2016-02-25: 100 ug via INTRAVENOUS

## 2016-02-25 MED ORDER — MIDAZOLAM HCL 2 MG/2ML IJ SOLN
INTRAMUSCULAR | Status: AC
  Filled 2016-02-25: qty 2

## 2016-02-25 MED ORDER — 0.9 % SODIUM CHLORIDE (POUR BTL) OPTIME
TOPICAL | Status: DC | PRN
  Administered 2016-02-25: 1000 mL

## 2016-02-25 MED ORDER — SUGAMMADEX SODIUM 200 MG/2ML IV SOLN
INTRAVENOUS | Status: DC | PRN
Start: 2016-02-25 — End: 2016-02-25
  Administered 2016-02-25: 200 mg via INTRAVENOUS

## 2016-02-25 MED ORDER — BUPIVACAINE-EPINEPHRINE (PF) 0.5% -1:200000 IJ SOLN
INTRAMUSCULAR | Status: AC
  Filled 2016-02-25: qty 30

## 2016-02-25 MED FILL — DOXYCYCLINE HYCLATE 100 MG: 100 | 7 days supply | Qty: 14 | Fill #0

## 2016-02-25 MED FILL — OXYCODONE/APAP 5-325: 5-325 | 4 days supply | Qty: 40 | Fill #0

## 2016-02-25 SURGICAL SUPPLY — 34 items
BLADE SURG ROTATE 9660 (MISCELLANEOUS) ×2 IMPLANT
CANISTER SUCTION 2500CC (MISCELLANEOUS) ×3 IMPLANT
COVER SURGICAL LIGHT HANDLE (MISCELLANEOUS) ×3 IMPLANT
DRAPE LAPAROTOMY T 102X78X121 (DRAPES) ×3 IMPLANT
DRAPE UTILITY XL STRL (DRAPES) ×2 IMPLANT
ELECT CAUTERY BLADE 6.4 (BLADE) ×3 IMPLANT
ELECT REM PT RETURN 9FT ADLT (ELECTROSURGICAL) ×3
ELECTRODE REM PT RTRN 9FT ADLT (ELECTROSURGICAL) ×1 IMPLANT
GAUZE SPONGE 4X4 12PLY STRL (GAUZE/BANDAGES/DRESSINGS) ×3 IMPLANT
GLOVE BIOGEL PI IND STRL 7.0 (GLOVE) IMPLANT
GLOVE BIOGEL PI INDICATOR 7.0 (GLOVE) ×2
GLOVE SURG SIGNA 7.5 PF LTX (GLOVE) ×3 IMPLANT
GOWN STRL REUS W/ TWL LRG LVL3 (GOWN DISPOSABLE) ×1 IMPLANT
GOWN STRL REUS W/ TWL XL LVL3 (GOWN DISPOSABLE) ×1 IMPLANT
GOWN STRL REUS W/TWL LRG LVL3 (GOWN DISPOSABLE) ×3
GOWN STRL REUS W/TWL XL LVL3 (GOWN DISPOSABLE) ×3
KIT BASIN OR (CUSTOM PROCEDURE TRAY) ×3 IMPLANT
KIT ROOM TURNOVER OR (KITS) ×3 IMPLANT
NS IRRIG 1000ML POUR BTL (IV SOLUTION) ×3 IMPLANT
PACK SURGICAL SETUP 50X90 (CUSTOM PROCEDURE TRAY) ×3 IMPLANT
PAD ABD 8X10 STRL (GAUZE/BANDAGES/DRESSINGS) ×2 IMPLANT
PAD ARMBOARD 7.5X6 YLW CONV (MISCELLANEOUS) ×6 IMPLANT
PENCIL BUTTON HOLSTER BLD 10FT (ELECTRODE) ×3 IMPLANT
SPECIMEN JAR SMALL (MISCELLANEOUS) ×3 IMPLANT
SPONGE GAUZE 4X4 12PLY STER LF (GAUZE/BANDAGES/DRESSINGS) ×2 IMPLANT
SPONGE LAP 18X18 X RAY DECT (DISPOSABLE) ×3 IMPLANT
SUT ETHILON 2 0 FS 18 (SUTURE) ×4 IMPLANT
TAPE CLOTH SURG 6X10 WHT LF (GAUZE/BANDAGES/DRESSINGS) ×2 IMPLANT
TOWEL OR 17X24 6PK STRL BLUE (TOWEL DISPOSABLE) ×3 IMPLANT
TOWEL OR 17X26 10 PK STRL BLUE (TOWEL DISPOSABLE) ×3 IMPLANT
TUBE CONNECTING 12'X1/4 (SUCTIONS) ×1
TUBE CONNECTING 12X1/4 (SUCTIONS) ×2 IMPLANT
UNDERPAD 30X30 INCONTINENT (UNDERPADS AND DIAPERS) ×1 IMPLANT
YANKAUER SUCT BULB TIP NO VENT (SUCTIONS) ×3 IMPLANT

## 2016-02-25 NOTE — Transfer of Care (Signed)
Immediate Anesthesia Transfer of Care Note  Patient: Devon PilotFrederick P Vanderheyden  Procedure(s) Performed: Procedure(s): CYST EXCISION PILONIDAL EXTENSIVE (N/A)  Patient Location: PACU  Anesthesia Type:General  Level of Consciousness: awake, alert  and patient cooperative  Airway & Oxygen Therapy: Patient Spontanous Breathing and Patient connected to face mask oxygen  Post-op Assessment: Report given to RN, Post -op Vital signs reviewed and stable and Patient moving all extremities  Post vital signs: Reviewed and stable  Last Vitals:  Filed Vitals:   02/25/16 1210 02/25/16 1214  BP: 119/100 145/90  Pulse: 110 112  Temp: 36.5 C   Resp: 14 19    Last Pain:  Filed Vitals:   02/25/16 1219  PainSc: 0-No pain         Complications: No apparent anesthesia complications

## 2016-02-25 NOTE — Anesthesia Preprocedure Evaluation (Addendum)
Anesthesia Evaluation  Patient identified by MRN, date of birth, ID band Patient awake    Reviewed: Allergy & Precautions, NPO status , Patient's Chart, lab work & pertinent test results  Airway Mallampati: I  TM Distance: >3 FB Neck ROM: Full    Dental  (+) Teeth Intact, Dental Advisory Given   Pulmonary    Pulmonary exam normal        Cardiovascular hypertension, Pt. on medications Normal cardiovascular exam     Neuro/Psych Depression    GI/Hepatic   Endo/Other    Renal/GU      Musculoskeletal   Abdominal   Peds  Hematology   Anesthesia Other Findings beard  Reproductive/Obstetrics                            Anesthesia Physical Anesthesia Plan  ASA: II  Anesthesia Plan: General   Post-op Pain Management:    Induction: Intravenous  Airway Management Planned: Oral ETT  Additional Equipment:   Intra-op Plan:   Post-operative Plan: Extubation in OR  Informed Consent: I have reviewed the patients History and Physical, chart, labs and discussed the procedure including the risks, benefits and alternatives for the proposed anesthesia with the patient or authorized representative who has indicated his/her understanding and acceptance.     Plan Discussed with: CRNA and Surgeon  Anesthesia Plan Comments:         Anesthesia Quick Evaluation

## 2016-02-25 NOTE — Anesthesia Procedure Notes (Signed)
Procedure Name: Intubation Date/Time: 02/25/2016 11:14 AM Performed by: Darcey NoraJAMES, Rene Gonsoulin B Pre-anesthesia Checklist: Patient identified, Emergency Drugs available, Suction available and Patient being monitored Patient Re-evaluated:Patient Re-evaluated prior to inductionOxygen Delivery Method: Circle system utilized Preoxygenation: Pre-oxygenation with 100% oxygen Intubation Type: IV induction Laryngoscope Size: Mac and 3 Grade View: Grade II Tube type: Oral Tube size: 7.5 mm Number of attempts: 1 Airway Equipment and Method: Stylet Placement Confirmation: ETT inserted through vocal cords under direct vision,  positive ETCO2 and breath sounds checked- equal and bilateral Secured at: 21 (cm at teeth) cm Tube secured with: Tape Dental Injury: Teeth and Oropharynx as per pre-operative assessment

## 2016-02-25 NOTE — Anesthesia Postprocedure Evaluation (Signed)
Anesthesia Post Note  Patient: Devon Collins  Procedure(s) Performed: Procedure(s) (LRB): CYST EXCISION PILONIDAL EXTENSIVE (N/A)  Patient location during evaluation: PACU Anesthesia Type: General Level of consciousness: awake and alert Pain management: pain level controlled Vital Signs Assessment: post-procedure vital signs reviewed and stable Respiratory status: spontaneous breathing, nonlabored ventilation, respiratory function stable and patient connected to nasal cannula oxygen Cardiovascular status: blood pressure returned to baseline and stable Postop Assessment: no signs of nausea or vomiting Anesthetic complications: no    Last Vitals:  Filed Vitals:   02/25/16 1226 02/25/16 1240  BP: 124/76   Pulse: 97   Temp:  36.4 C  Resp: 16     Last Pain:  Filed Vitals:   02/25/16 1240  PainSc: 0-No pain                 Jaquana Geiger DAVID

## 2016-02-25 NOTE — Discharge Instructions (Signed)
Ok to shower tomorrow  Ice pack and ibuprofen for pain  Expect drainage

## 2016-02-25 NOTE — Op Note (Signed)
NAME:  Devon Collins, Devon Collins              ACCOUNT NO.:  [id number]  MEDICAL RECORD NO.:  [id number]  LOCATION:  [facility]                         FACILITY:  [facility]  PHYSICIAN:  Abigail Miyamotoouglas Dilraj Killgore, M.D. DATE OF BIRTH:  Mar 06, 1981  DATE OF PROCEDURE:  02/25/2016 DATE OF DISCHARGE:  02/25/2016                              OPERATIVE REPORT   PREOPERATIVE DIAGNOSIS:  Pilonidal cyst.  POSTOPERATIVE DIAGNOSIS:  Pilonidal cyst.  PROCEDURE:  Excision of pilonidal cyst.  SURGEON:  Abigail Miyamotoouglas Maigen Mozingo, M.D.  ANESTHESIA:  General with 0.5% Marcaine with epinephrine.  ESTIMATED BLOOD LOSS:  Minimal.  FINDINGS:  The patient was found to have multiple ingrown hairs at the gluteal cleft.  PROCEDURE IN DETAIL:  The patient was brought to the operating room and identified as [name].  General anesthesia was induced.  He was then placed in a prone position on the operating room table.  His gluteal cleft area was then prepped and draped in usual sterile fashion. I performed an elliptical incision around all the chronic ingrown hairs and previous infected site with a scalpel.  I took this down to the subcutaneous tissue with electrocautery and then widely excised the pilonidal tissue going down to the sacrum.  Once this tissue was removed, I irrigated the wound thoroughly with saline.  I and achieved hemostasis with cautery.  I then anesthetized the wound circumferentially with Marcaine with epinephrine.  I then closed the subcutaneous tissues with interrupted 3-0 Vicryl sutures and closed the skin with interrupted 2-0 nylon sutures.  Gauze and tape were then applied.  The patient tolerated the procedure well.  All sponge, needle, and instrument countswere correct at the end of the procedure.  The patient was then extubated in the operating room and taken in a stable condition to the recovery room.     Abigail Miyamotoouglas Doaa Kendzierski, M.D.     DB/MEDQ  D:  02/25/2016  T:  02/25/2016  Job:  [id number]

## 2016-02-25 NOTE — Interval H&P Note (Signed)
History and Physical Interval Note: No change in H and P  02/25/2016 10:17 AM  Devon PilotFrederick P Segal  has presented today for surgery, with the diagnosis of Pilonidal cyst  The various methods of treatment have been discussed with the patient and family. After consideration of risks, benefits and other options for treatment, the patient has consented to  Procedure(s): CYST EXCISION PILONIDAL EXTENSIVE (N/A) as a surgical intervention .  The patient's history has been reviewed, patient examined, no change in status, stable for surgery.  I have reviewed the patient's chart and labs.  Questions were answered to the patient's satisfaction.     [name]

## 2016-02-25 NOTE — Op Note (Signed)
CYST EXCISION PILONIDAL Procedure Note  Lodema PilotFrederick P Escudero 02/25/2016   Pre-op Diagnosis: Pilonidal cyst     Post-op Diagnosis: same  Procedure(s): CYST EXCISION PILONIDAL   Surgeon(s): Abigail Miyamotoouglas Henna Derderian, MD  Anesthesia: General  Staff:  Circulator: Christene Slatesushdan M Islam, RN Scrub Person: Donella StadeNoukone M Panchit Circulator Assistant: Doy MinceSharon P Hitchcock, RN  Estimated Blood Loss: Minimal                 Arline Ketter A   Date: 02/25/2016  Time: 11:50 AM

## 2016-02-26 ENCOUNTER — Encounter (HOSPITAL_COMMUNITY): Payer: Self-pay | Admitting: Surgery

## 2016-03-25 MED FILL — OXYCODONE/APAP 5-325: 5-325 | 4 days supply | Qty: 40 | Fill #0

## 2016-04-18 ENCOUNTER — Other Ambulatory Visit: Payer: Self-pay

## 2016-04-18 DIAGNOSIS — F329 Major depressive disorder, single episode, unspecified: Secondary | ICD-10-CM

## 2016-04-18 DIAGNOSIS — F32A Depression, unspecified: Secondary | ICD-10-CM

## 2016-04-23 ENCOUNTER — Other Ambulatory Visit: Payer: Self-pay | Admitting: *Deleted

## 2016-04-23 DIAGNOSIS — I10 Essential (primary) hypertension: Secondary | ICD-10-CM

## 2016-04-23 DIAGNOSIS — E785 Hyperlipidemia, unspecified: Secondary | ICD-10-CM

## 2016-04-23 DIAGNOSIS — F32A Depression, unspecified: Secondary | ICD-10-CM

## 2016-04-23 DIAGNOSIS — F329 Major depressive disorder, single episode, unspecified: Secondary | ICD-10-CM

## 2016-04-23 MED ORDER — SIMVASTATIN 20 MG PO TABS: 20.0000 mg | ORAL_TABLET | Freq: Every day | ORAL | 0 refills | Status: DC

## 2016-04-23 MED ORDER — DESVENLAFAXINE SUCCINATE ER 100 MG PO TB24: 100.0000 mg | ORAL_TABLET | Freq: Every day | ORAL | 0 refills | Status: DC

## 2016-04-23 MED ORDER — LISINOPRIL-HYDROCHLOROTHIAZIDE 10-12.5 MG PO TABS: 1.0000 | ORAL_TABLET | Freq: Every day | ORAL | 0 refills | Status: DC

## 2016-04-23 MED FILL — DESVENLAFAXINE SUC ER 100 M: 100 | 30 days supply | Qty: 30 | Fill #0

## 2016-04-23 MED FILL — LISINOPRIL-HCTZ 10-12.5 MG: 10-12.5 | 30 days supply | Qty: 30 | Fill #0

## 2016-04-23 MED FILL — SIMVASTATIN 20 MG TABLET: 20 | 30 days supply | Qty: 30 | Fill #0

## 2016-04-25 ENCOUNTER — Other Ambulatory Visit: Payer: 59 | Admitting: Family

## 2016-05-08 ENCOUNTER — Ambulatory Visit (INDEPENDENT_AMBULATORY_CARE_PROVIDER_SITE_OTHER): Payer: 59 | Admitting: Family

## 2016-05-08 ENCOUNTER — Encounter: Payer: Self-pay | Admitting: Family

## 2016-05-08 VITALS — BP 126/83 | HR 91 | Temp 98.0°F | Ht 71.0 in | Wt 222.2 lb

## 2016-05-08 DIAGNOSIS — E785 Hyperlipidemia, unspecified: Secondary | ICD-10-CM

## 2016-05-08 DIAGNOSIS — F329 Major depressive disorder, single episode, unspecified: Secondary | ICD-10-CM

## 2016-05-08 DIAGNOSIS — I10 Essential (primary) hypertension: Secondary | ICD-10-CM | POA: Diagnosis not present

## 2016-05-08 DIAGNOSIS — Z0000 Encounter for general adult medical examination without abnormal findings: Secondary | ICD-10-CM

## 2016-05-08 DIAGNOSIS — Z23 Encounter for immunization: Secondary | ICD-10-CM

## 2016-05-08 DIAGNOSIS — E669 Obesity, unspecified: Secondary | ICD-10-CM

## 2016-05-08 DIAGNOSIS — F32A Depression, unspecified: Secondary | ICD-10-CM

## 2016-05-08 NOTE — Progress Notes (Signed)
 Subjective:    Patient ID: Devon Collins, male    DOB: 11/22/1980, 35 y.o.   MRN: [id number]  Pt presents to the office today for CPE and lab work.  Hypertension  This is a chronic problem. The current episode started more than 1 year ago. The problem has been resolved since onset. The problem is controlled. Pertinent negatives include no anxiety, headaches, palpitations, peripheral edema or shortness of breath. Risk factors for coronary artery disease include obesity and male gender. Past treatments include ACE inhibitors and diuretics. The current treatment provides significant improvement. There is no history of kidney disease, CAD/MI, CVA, heart failure or a thyroid problem. There is no history of sleep apnea.  Depression         This is a chronic problem.  The current episode started more than 1 year ago.   The onset quality is sudden.   The problem occurs constantly.  The problem has been waxing and waning since onset.  Associated symptoms include no fatigue, no hopelessness, no restlessness, no headaches and not sad.  Compliance with treatment is good.  Previous treatment provided significant relief.   Pertinent negatives include no thyroid problem and no anxiety. Hyperlipidemia  This is a chronic problem. The current episode started more than 1 year ago. The problem is uncontrolled. Recent lipid tests were reviewed and are high. Exacerbating diseases include obesity. Pertinent negatives include no shortness of breath. Current antihyperlipidemic treatment includes statins. The current treatment provides moderate improvement of lipids. Risk factors for coronary artery disease include a sedentary lifestyle, male sex and hypertension.      Review of Systems  Constitutional: Negative.  Negative for fatigue.  HENT: Negative.   Respiratory: Negative.  Negative for shortness of breath.   Cardiovascular: Negative.  Negative for palpitations.  Gastrointestinal: Negative.   Endocrine:  Negative.   Genitourinary: Negative.   Musculoskeletal: Negative.   Neurological: Negative.  Negative for headaches.  Hematological: Negative.   Psychiatric/Behavioral: Positive for depression.  All other systems reviewed and are negative.      Objective:   Physical Exam  Constitutional: He is oriented to person, place, and time. He appears well-developed and well-nourished. No distress.  HENT:  Head: Normocephalic.  Right Ear: External ear normal.  Left Ear: External ear normal.  Nose: Nose normal.  Mouth/Throat: Oropharynx is clear and moist.  Eyes: Pupils are equal, round, and reactive to light. Right eye exhibits no discharge. Left eye exhibits no discharge.  Neck: Normal range of motion. Neck supple. No thyromegaly present.  Cardiovascular: Normal rate, regular rhythm, normal heart sounds and intact distal pulses.   No murmur heard. Pulmonary/Chest: Effort normal and breath sounds normal. No respiratory distress. He has no wheezes.  Abdominal: Soft. Bowel sounds are normal. He exhibits no distension. There is no tenderness.  Musculoskeletal: Normal range of motion. He exhibits no edema or tenderness.  Neurological: He is alert and oriented to person, place, and time. He has normal reflexes. No cranial nerve deficit.  Skin: Skin is warm and dry. No rash noted. No erythema.  Psychiatric: He has a normal mood and affect. His behavior is normal. Judgment and thought content normal.  Vitals reviewed.   BP 126/83   Pulse 91   Temp 98 F (36.7 C) (Oral)   Ht 5' 11" (1.803 m)   Wt 222 lb 3.2 oz (100.8 kg)   BMI 30.99 kg/m        Assessment & Plan:  1. Essential hypertension -   CMP14+EGFR  2. Depression - CMP14+EGFR  3. Hyperlipidemia - CMP14+EGFR - Lipid panel  4. Obesity (BMI 30-39.9) - CMP14+EGFR  5. Annual physical exam - CMP14+EGFR - Lipid panel - CBC with Differential/Platelet - Thyroid Panel With TSH - VITAMIN D 25 Hydroxy (Vit-D Deficiency,  Fractures)   Continue all meds Labs pending Health Maintenance reviewed Diet and exercise encouraged RTO 1 year  Evelina Dun, FNP

## 2016-05-08 NOTE — Patient Instructions (Signed)

## 2016-05-09 DIAGNOSIS — Z23 Encounter for immunization: Secondary | ICD-10-CM

## 2016-05-09 DIAGNOSIS — Z0000 Encounter for general adult medical examination without abnormal findings: Secondary | ICD-10-CM | POA: Diagnosis not present

## 2016-05-09 LAB — CBC WITH DIFFERENTIAL/PLATELET
BASOS ABS: 0 10*3/uL (ref 0.0–0.2)
Basos: 0 %
EOS (ABSOLUTE): 0.2 10*3/uL (ref 0.0–0.4)
Eos: 2 %
HEMOGLOBIN: 14 g/dL (ref 12.6–17.7)
Hematocrit: 40.7 % (ref 37.5–51.0)
IMMATURE GRANS (ABS): 0.1 10*3/uL (ref 0.0–0.1)
IMMATURE GRANULOCYTES: 1 %
LYMPHS: 29 %
Lymphocytes Absolute: 2.3 10*3/uL (ref 0.7–3.1)
MCH: 29.9 pg (ref 26.6–33.0)
MCHC: 34.4 g/dL (ref 31.5–35.7)
MCV: 87 fL (ref 79–97)
MONOCYTES: 9 %
Monocytes Absolute: 0.7 10*3/uL (ref 0.1–0.9)
NEUTROS ABS: 4.8 10*3/uL (ref 1.4–7.0)
Neutrophils: 59 %
Platelets: 256 10*3/uL (ref 150–379)
RBC: 4.69 x10E6/uL (ref 4.14–5.80)
RDW: 13.5 % (ref 12.3–15.4)
WBC: 8.1 10*3/uL (ref 3.4–10.8)

## 2016-05-09 LAB — CMP14+EGFR
A/G RATIO: 2 (ref 1.2–2.2)
ALK PHOS: 71 IU/L (ref 39–117)
ALT: 24 IU/L (ref 0–44)
AST: 22 IU/L (ref 0–40)
Albumin: 4.8 g/dL (ref 3.5–5.5)
BUN/Creatinine Ratio: 18 (ref 9–20)
BUN: 19 mg/dL (ref 6–20)
Bilirubin Total: 0.4 mg/dL (ref 0.0–1.2)
CO2: 26 mmol/L (ref 18–29)
Calcium: 9.3 mg/dL (ref 8.7–10.2)
Chloride: 95 mmol/L — ABNORMAL LOW (ref 96–106)
Creatinine, Ser: 1.05 mg/dL (ref 0.76–1.27)
GFR calc Af Amer: 105 mL/min/{1.73_m2} (ref >59)
GFR, EST NON AFRICAN AMERICAN: 91 mL/min/{1.73_m2} (ref >59)
GLOBULIN, TOTAL: 2.4 g/dL (ref 1.5–4.5)
Glucose: 86 mg/dL (ref 65–99)
POTASSIUM: 4.1 mmol/L (ref 3.5–5.2)
SODIUM: 137 mmol/L (ref 134–144)
Total Protein: 7.2 g/dL (ref 6.0–8.5)

## 2016-05-09 LAB — LIPID PANEL
CHOLESTEROL TOTAL: 132 mg/dL (ref 100–199)
Chol/HDL Ratio: 3.8 ratio units (ref 0.0–5.0)
HDL: 35 mg/dL — ABNORMAL LOW (ref >39)
LDL Calculated: 70 mg/dL (ref 0–99)
TRIGLYCERIDES: 134 mg/dL (ref 0–149)
VLDL Cholesterol Cal: 27 mg/dL (ref 5–40)

## 2016-05-09 LAB — THYROID PANEL WITH TSH
FREE THYROXINE INDEX: 2.1 (ref 1.2–4.9)
T3 UPTAKE RATIO: 27 % (ref 24–39)
T4 TOTAL: 7.8 ug/dL (ref 4.5–12.0)
TSH: 1.48 u[IU]/mL (ref 0.450–4.500)

## 2016-05-09 LAB — VITAMIN D 25 HYDROXY (VIT D DEFICIENCY, FRACTURES): VIT D 25 HYDROXY: 43.4 ng/mL (ref 30.0–100.0)

## 2016-05-13 ENCOUNTER — Other Ambulatory Visit: Payer: Self-pay | Admitting: Family

## 2016-05-21 ENCOUNTER — Telehealth: Payer: Self-pay | Admitting: Family

## 2016-05-21 DIAGNOSIS — E785 Hyperlipidemia, unspecified: Secondary | ICD-10-CM

## 2016-05-21 DIAGNOSIS — F331 Major depressive disorder, recurrent, moderate: Secondary | ICD-10-CM

## 2016-05-21 DIAGNOSIS — I10 Essential (primary) hypertension: Secondary | ICD-10-CM

## 2016-05-21 MED ORDER — SIMVASTATIN 20 MG PO TABS: 20.0000 mg | ORAL_TABLET | Freq: Every day | ORAL | 3 refills | Status: DC

## 2016-05-21 MED ORDER — LISINOPRIL-HYDROCHLOROTHIAZIDE 10-12.5 MG PO TABS: 1.0000 | ORAL_TABLET | Freq: Every day | ORAL | 3 refills | Status: DC

## 2016-05-21 MED ORDER — DESVENLAFAXINE SUCCINATE ER 100 MG PO TB24: 100.0000 mg | ORAL_TABLET | Freq: Every day | ORAL | 3 refills | Status: DC

## 2016-05-21 NOTE — Telephone Encounter (Signed)
Patient aware.

## 2016-05-21 NOTE — Telephone Encounter (Signed)
Please address

## 2016-09-20 ENCOUNTER — Encounter (HOSPITAL_COMMUNITY): Payer: Self-pay | Admitting: Family Medicine

## 2016-09-20 ENCOUNTER — Ambulatory Visit (HOSPITAL_COMMUNITY)
Admission: EM | Admit: 2016-09-20 | Discharge: 2016-09-20 | Disposition: A | Discharge status: Home / Self Care | Payer: BC Managed Care – PPO | Attending: Family Medicine | Admitting: Family Medicine

## 2016-09-20 DIAGNOSIS — S61419A Laceration without foreign body of unspecified hand, initial encounter: Secondary | ICD-10-CM

## 2016-09-20 MED ORDER — SULFAMETHOXAZOLE-TRIMETHOPRIM 800-160 MG PO TABS: 1.0000 | ORAL_TABLET | Freq: Two times a day (BID) | ORAL | 0 refills | Status: AC

## 2016-09-20 MED ORDER — ONDANSETRON 4 MG PO TBDP
ORAL_TABLET | ORAL | Status: AC
Start: 2016-09-20 — End: 2016-09-20
  Filled 2016-09-20: qty 1

## 2016-09-20 NOTE — ED Triage Notes (Signed)
Pt her for lacerations to bilateral hands. sts he was opening a jar earlier. sts tetanus up to date.

## 2016-09-20 NOTE — Discharge Instructions (Signed)
3 sutures have been placed on your right hand and 4 sutures have been placed in your left hand. Your wounds have been cleaned, no evidence of retained foreign body. I have sent a prescription for an antibiotic to your pharmacy called bactrim take 1 tablet twice a day for 7 days. Keep your wounds clean and dry, you may use antibiotic ointment as needed. Return to clinic or follow up with your primary care provider in 1 week for removal.

## 2016-09-20 NOTE — ED Provider Notes (Signed)
CSN: [id number]     Arrival date & time 09/20/16  1758 History   First MD Initiated Contact with Patient 09/20/16 1921     Chief Complaint  Patient presents with  . Laceration   (Consider location/radiation/quality/duration/timing/severity/associated sxs/prior Treatment) 36 year old male presents to clinic with chief complaint of laceration to the hands. He had laceration to both hands while opening a glass jar that broke in his hands. Has laceration on the palm of his left hand and one on his right. His tetenus is up to date.    Laceration    Past Medical History:  Diagnosis Date  . Depression   . Hyperlipemia   . Hypertension    Past Surgical History:  Procedure Laterality Date  . PILONIDAL CYST EXCISION N/A 02/25/2016   Procedure: CYST EXCISION PILONIDAL EXTENSIVE;  Surgeon: Abigail Miyamoto, MD;  Location: [facility] OR;  Service: General;  Laterality: N/A;  . TONSILLECTOMY     No family history on file. Social History  Substance Use Topics  . Smoking status: Never Smoker  . Smokeless tobacco: Never Used  . Alcohol use No    Review of Systems  Reason unable to perform ROS: as covered in HPI.  All other systems reviewed and are negative.   Allergies  No known allergies  Home Medications   Prior to Admission medications   Medication Sig Start Date End Date Taking? Authorizing Provider  desvenlafaxine (PRISTIQ) 100 MG 24 hr tablet Take 1 tablet (100 mg total) by mouth daily. 05/21/16   Elige Radon Dettinger, MD  lisinopril-hydrochlorothiazide (PRINZIDE,ZESTORETIC) 10-12.5 MG tablet Take 1 tablet by mouth daily. 05/21/16   Elige Radon Dettinger, MD  simvastatin (ZOCOR) 20 MG tablet Take 1 tablet (20 mg total) by mouth daily. 05/21/16   Elige Radon Dettinger, MD  sulfamethoxazole-trimethoprim (BACTRIM DS,SEPTRA DS) 800-160 MG tablet Take 1 tablet by mouth 2 (two) times daily. 09/20/16 09/27/16  Dorena Bodo, NP   Meds Ordered and Administered this Visit  Medications - No data to  display  BP 135/85   Pulse 85   Temp 98.4 F (36.9 C)   Resp 18   SpO2 100%  No data found.   Physical Exam  Constitutional: He is oriented to person, place, and time. He appears well-developed and well-nourished. No distress.  HENT:  Head: Normocephalic and atraumatic.  Musculoskeletal: Normal range of motion.       Hands: Neurological: He is alert and oriented to person, place, and time.  Skin: Skin is warm and dry. Capillary refill takes less than 2 seconds. He is not diaphoretic.  Psychiatric: He has a normal mood and affect.  Nursing note and vitals reviewed.   Urgent Care Course     .Marland KitchenLaceration Repair Date/Time: 09/20/2016 9:07 PM Performed by: Dorena Bodo Authorized by: Elvina Sidle   Consent:    Consent obtained:  Verbal   Consent given by:  Patient   Risks discussed:  Infection, pain, need for additional repair, poor cosmetic result and retained foreign body   Alternatives discussed:  No treatment and delayed treatment Anesthesia (see MAR for exact dosages):    Anesthesia method:  Local infiltration   Local anesthetic:  Lidocaine 1% w/o epi Laceration details:    Location:  Hand   Hand location:  L palm   Length (cm):  3   Depth (mm):  0.4 Repair type:    Repair type:  Simple Exploration:    Hemostasis achieved with:  Direct pressure   Wound exploration: entire  depth of wound probed and visualized   Treatment:    Area cleansed with:  Betadine and saline   Amount of cleaning:  Standard   Irrigation solution:  Sterile water   Irrigation volume:  30   Irrigation method:  Syringe   Visualized foreign bodies/material removed: no   Skin repair:    Repair method:  Sutures   Suture size:  4-0   Suture material:  Prolene   Suture technique:  Simple interrupted   Number of sutures:  7 Approximation:    Approximation:  Close   Vermilion border: well-aligned   Post-procedure details:    Dressing:  Adhesive bandage   Patient tolerance of  procedure:  Tolerated well, no immediate complications Comments:     4 sutures on the left palm for 3 cm laceration, 3 sutures on the right palm for 2 cm laceration   (including critical care time)  Labs Review Labs Reviewed - No data to display  Imaging Review No results found.   Visual Acuity Review  Right Eye Distance:   Left Eye Distance:   Bilateral Distance:    Right Eye Near:   Left Eye Near:    Bilateral Near:         MDM   1. Laceration of hand without foreign body, unspecified laterality, initial encounter   3 sutures have been placed on your right hand and 4 sutures have been placed in your left hand. Your wounds have been cleaned, no evidence of retained foreign body. I have sent a prescription for an antibiotic to your pharmacy called bactrim take 1 tablet twice a day for 7 days. Keep your wounds clean and dry, you may use antibiotic ointment as needed. Return to clinic or follow up with your primary care provider in 1 week for removal.     Dorena BodoLawrence Ellisa Devivo, NP 09/20/16 2113

## 2017-05-22 ENCOUNTER — Other Ambulatory Visit: Payer: Self-pay | Admitting: Family Medicine

## 2017-05-22 DIAGNOSIS — E785 Hyperlipidemia, unspecified: Secondary | ICD-10-CM

## 2017-05-22 DIAGNOSIS — F331 Major depressive disorder, recurrent, moderate: Secondary | ICD-10-CM

## 2017-05-22 DIAGNOSIS — I10 Essential (primary) hypertension: Secondary | ICD-10-CM

## 2017-05-22 NOTE — Telephone Encounter (Signed)
Last seen 05/08/16  [facility]

## 2017-06-04 ENCOUNTER — Ambulatory Visit: Payer: 59 | Admitting: Family

## 2017-06-05 ENCOUNTER — Ambulatory Visit (INDEPENDENT_AMBULATORY_CARE_PROVIDER_SITE_OTHER): Payer: Managed Care, Other (non HMO) | Admitting: Family

## 2017-06-05 ENCOUNTER — Encounter: Payer: Self-pay | Admitting: Family

## 2017-06-05 VITALS — BP 139/89 | HR 95 | Temp 97.2°F | Ht 71.0 in | Wt 200.0 lb

## 2017-06-05 DIAGNOSIS — I10 Essential (primary) hypertension: Secondary | ICD-10-CM

## 2017-06-05 DIAGNOSIS — F411 Generalized anxiety disorder: Secondary | ICD-10-CM

## 2017-06-05 DIAGNOSIS — Z0000 Encounter for general adult medical examination without abnormal findings: Secondary | ICD-10-CM

## 2017-06-05 DIAGNOSIS — E785 Hyperlipidemia, unspecified: Secondary | ICD-10-CM

## 2017-06-05 DIAGNOSIS — E669 Obesity, unspecified: Secondary | ICD-10-CM

## 2017-06-05 DIAGNOSIS — F331 Major depressive disorder, recurrent, moderate: Secondary | ICD-10-CM

## 2017-06-05 MED ORDER — LISINOPRIL-HYDROCHLOROTHIAZIDE 10-12.5 MG PO TABS: 1.0000 | ORAL_TABLET | Freq: Every day | ORAL | 1 refills | Status: DC

## 2017-06-05 MED ORDER — DESVENLAFAXINE SUCCINATE ER 100 MG PO TB24: 100.0000 mg | ORAL_TABLET | Freq: Every day | ORAL | 0 refills | Status: DC

## 2017-06-05 MED ORDER — BUPROPION HCL ER (XL) 150 MG PO TB24: 150.0000 mg | ORAL_TABLET | Freq: Every day | ORAL | 1 refills | Status: DC

## 2017-06-05 MED ORDER — SIMVASTATIN 20 MG PO TABS: 20.0000 mg | ORAL_TABLET | Freq: Every day | ORAL | 1 refills | Status: DC

## 2017-06-05 NOTE — Progress Notes (Signed)
Subjective:    Patient ID: Devon Collins, male    DOB: Feb 14, 1981, 36 y.o.   MRN: [id number]  PT presents to the office today CPE. Pt states 6 weeks ago his wife told him she wanted a divorce.  Hypertension  This is a chronic problem. The current episode started more than 1 year ago. The problem has been resolved since onset. The problem is controlled. Associated symptoms include anxiety and malaise/fatigue. Pertinent negatives include no headaches, peripheral edema or shortness of breath. Risk factors for coronary artery disease include dyslipidemia, obesity and male gender. The current treatment provides moderate improvement. There is no history of kidney disease, CAD/MI, CVA or heart failure.  Hyperlipidemia  This is a chronic problem. The current episode started more than 1 year ago. The problem is controlled. Recent lipid tests were reviewed and are normal. Pertinent negatives include no shortness of breath. Current antihyperlipidemic treatment includes statins. The current treatment provides moderate improvement of lipids. Risk factors for coronary artery disease include dyslipidemia, male sex, a sedentary lifestyle and post-menopausal.  Depression         This is a chronic problem.  The current episode started more than 1 year ago.   The onset quality is gradual.   The problem occurs intermittently.  The problem has been waxing and waning since onset.  Associated symptoms include helplessness, hopelessness, irritable, restlessness, decreased interest and sad.  Associated symptoms include no headaches.  Compliance with treatment is good.  Past medical history includes anxiety.   Anxiety  Presents for follow-up visit. Symptoms include depressed mood, excessive worry, irritability, nervous/anxious behavior and restlessness. Patient reports no shortness of breath. Symptoms occur most days.        Review of Systems  Constitutional: Positive for irritability and malaise/fatigue.    Respiratory: Negative for shortness of breath.   Neurological: Negative for headaches.  Psychiatric/Behavioral: Positive for depression. The patient is nervous/anxious.   All other systems reviewed and are negative.      Objective:   Physical Exam  Constitutional: He is oriented to person, place, and time. He appears well-developed and well-nourished. He is irritable. No distress.  HENT:  Head: Normocephalic.  Right Ear: External ear normal.  Left Ear: External ear normal.  Mouth/Throat: Oropharynx is clear and moist.  Eyes: Pupils are equal, round, and reactive to light. Right eye exhibits no discharge. Left eye exhibits no discharge.  Neck: Normal range of motion. Neck supple. No thyromegaly present.  Cardiovascular: Normal rate, regular rhythm, normal heart sounds and intact distal pulses.   No murmur heard. Pulmonary/Chest: Effort normal and breath sounds normal. No respiratory distress. He has no wheezes.  Abdominal: Soft. Bowel sounds are normal. He exhibits no distension. There is no tenderness.  Musculoskeletal: Normal range of motion. He exhibits no edema or tenderness.  Neurological: He is alert and oriented to person, place, and time.  Skin: Skin is warm and dry. No rash noted. No erythema.  Psychiatric: His behavior is normal. Judgment and thought content normal. His mood appears anxious.  Pt tearful  Vitals reviewed.     BP 139/89   Pulse 95   Temp (!) 97.2 F (36.2 C) (Oral)   Ht 5' 11"  (1.803 m)   Wt 200 lb (90.7 kg)   BMI 27.89 kg/m      Assessment & Plan:  1. Essential hypertension - lisinopril-hydrochlorothiazide (PRINZIDE,ZESTORETIC) 10-12.5 MG tablet; Take 1 tablet by mouth daily.  Dispense: 90 tablet; Refill: 1 - CMP14+EGFR - CBC  with Differential/Platelet  2. Hyperlipidemia, unspecified hyperlipidemia type - simvastatin (ZOCOR) 20 MG tablet; Take 1 tablet (20 mg total) by mouth daily.  Dispense: 90 tablet; Refill: 1 - CMP14+EGFR - CBC with  Differential/Platelet  3. Moderate episode of recurrent major depressive disorder (HCC) Will add wellbutrin 150 mg today Stress management discussed - CMP14+EGFR - CBC with Differential/Platelet - desvenlafaxine (PRISTIQ) 100 MG 24 hr tablet; Take 1 tablet (100 mg total) by mouth daily.  Dispense: 90 tablet; Refill: 0 - buPROPion (WELLBUTRIN XL) 150 MG 24 hr tablet; Take 1 tablet (150 mg total) by mouth daily.  Dispense: 90 tablet; Refill: 1 - Ambulatory referral to Psychiatry  4. Obesity (BMI 30-39.9) - CMP14+EGFR - CBC with Differential/Platelet  5. GAD (generalized anxiety disorder) - CMP14+EGFR - CBC with Differential/Platelet - desvenlafaxine (PRISTIQ) 100 MG 24 hr tablet; Take 1 tablet (100 mg total) by mouth daily.  Dispense: 90 tablet; Refill: 0 - buPROPion (WELLBUTRIN XL) 150 MG 24 hr tablet; Take 1 tablet (150 mg total) by mouth daily.  Dispense: 90 tablet; Refill: 1 - Ambulatory referral to Psychiatry  6. Annual physical exam - CMP14+EGFR - CBC with Differential/Platelet - Lipid panel - VITAMIN D 25 Hydroxy (Vit-D Deficiency, Fractures) - TSH   Continue all meds Labs pending Health Maintenance reviewed Diet and exercise encouraged RTO 6 weeks   Evelina Dun, FNP

## 2017-06-05 NOTE — Patient Instructions (Signed)
Major Depressive Disorder, Adult Major depressive disorder (MDD) is a mental health condition. It may also be called clinical depression or unipolar depression. MDD usually causes feelings of sadness, hopelessness, or helplessness. MDD can also cause physical symptoms. It can interfere with work, school, relationships, and other everyday activities. MDD may be mild, moderate, or severe. It may occur once (single episode major depressive disorder) or it may occur multiple times (recurrent major depressive disorder). What are the causes? The exact cause of this condition is not known. MDD is most likely caused by a combination of things, which may include:  Genetic factors. These are traits that are passed along from parent to child.  Individual factors. Your personality, your behavior, and the way you handle your thoughts and feelings may contribute to MDD. This includes personality traits and behaviors learned from others.  Physical factors, such as: ? Differences in the part of your brain that controls emotion. This part of your brain may be different than it is in people who do not have MDD. ? Long-term (chronic) medical or psychiatric illnesses.  Social factors. Traumatic experiences or major life changes may play a role in the development of MDD.  What increases the risk? This condition is more likely to develop in women. The following factors may also make you more likely to develop MDD:  A family history of depression.  Troubled family relationships.  Abnormally low levels of certain brain chemicals.  Traumatic events in childhood, especially abuse or the loss of a parent.  Being under a lot of stress, or long-term stress, especially from upsetting life experiences or losses.  A history of: ? Chronic physical illness. ? Other mental health disorders. ? Substance abuse.  Poor living conditions.  Experiencing social exclusion or discrimination on a regular basis.  What are  the signs or symptoms? The main symptoms of MDD typically include:  Constant depressed or irritable mood.  Loss of interest in things and activities.  MDD symptoms may also include:  Sleeping or eating too much or too little.  Unexplained weight change.  Fatigue or low energy.  Feelings of worthlessness or guilt.  Difficulty thinking clearly or making decisions.  Thoughts of suicide or of harming others.  Physical agitation or weakness.  Isolation.  Severe cases of MDD may also occur with other symptoms, such as:  Delusions or hallucinations, in which you imagine things that are not real (psychotic depression).  Low-level depression that lasts at least a year (chronic depression or persistent depressive disorder).  Extreme sadness and hopelessness (melancholic depression).  Trouble speaking and moving (catatonic depression).  How is this diagnosed? This condition may be diagnosed based on:  Your symptoms.  Your medical history, including your mental health history. This may involve tests to evaluate your mental health. You may be asked questions about your lifestyle, including any drug and alcohol use, and how long you have had symptoms of MDD.  A physical exam.  Blood tests to rule out other conditions.  You must have a depressed mood and at least four other MDD symptoms most of the day, nearly every day in the same 2-week timeframe before your health care provider can confirm a diagnosis of MDD. How is this treated? This condition is usually treated by mental health professionals, such as psychologists, psychiatrists, and clinical social workers. You may need more than one type of treatment. Treatment may include:  Psychotherapy. This is also called talk therapy or counseling. Types of psychotherapy include: ? Cognitive behavioral   therapy (CBT). This type of therapy teaches you to recognize unhealthy feelings, thoughts, and behaviors, and replace them with  positive thoughts and actions. ? Interpersonal therapy (IPT). This helps you to improve the way you relate to and communicate with others. ? Family therapy. This treatment includes members of your family.  Medicine to treat anxiety and depression, or to help you control certain emotions and behaviors.  Lifestyle changes, such as: ? Limiting alcohol and drug use. ? Exercising regularly. ? Getting plenty of sleep. ? Making healthy eating choices. ? Spending more time outdoors.  Treatments involving stimulation of the brain can be used in situations with extremely severe symptoms, or when medicine or other therapies do not work over time. These treatments include electroconvulsive therapy, transcranial magnetic stimulation, and vagal nerve stimulation. Follow these instructions at home: Activity  Return to your normal activities as told by your health care provider.  Exercise regularly and spend time outdoors as told by your health care provider. General instructions  Take over-the-counter and prescription medicines only as told by your health care provider.  Do not drink alcohol. If you drink alcohol, limit your alcohol intake to no more than 1 drink a day for nonpregnant women and 2 drinks a day for men. One drink equals 12 oz of beer, 5 oz of wine, or 1 oz of hard liquor. Alcohol can affect any antidepressant medicines you are taking. Talk to your health care provider about your alcohol use.  Eat a healthy diet and get plenty of sleep.  Find activities that you enjoy doing, and make time to do them.  Consider joining a support group. Your health care provider may be able to recommend a support group.  Keep all follow-up visits as told by your health care provider. This is important. Where to find more information: National Alliance on Mental Illness  www.nami.org  [place] National Institute of Mental Health  www.nimh.nih.gov  National Suicide Prevention  Lifeline  1-800-273-TALK (8255). This is free, 24-hour help.  Contact a health care provider if:  Your symptoms get worse.  You develop new symptoms. Get help right away if:  You self-harm.  You have serious thoughts about hurting yourself or others.  You see, hear, taste, smell, or feel things that are not present (hallucinate). This information is not intended to replace advice given to you by your health care provider. Make sure you discuss any questions you have with your health care provider. Document Released: 11/29/2012 Document Revised: 04/10/2016 Document Reviewed: 02/13/2016 Elsevier Interactive Patient Education  2017 [employer] Inc[employer]  

## 2017-06-06 LAB — CBC WITH DIFFERENTIAL/PLATELET
BASOS ABS: 0 10*3/uL (ref 0.0–0.2)
BASOS: 0 %
EOS (ABSOLUTE): 0.1 10*3/uL (ref 0.0–0.4)
Eos: 1 %
HEMOGLOBIN: 14.4 g/dL (ref 13.0–17.7)
Hematocrit: 40.4 % (ref 37.5–51.0)
IMMATURE GRANS (ABS): 0 10*3/uL (ref 0.0–0.1)
IMMATURE GRANULOCYTES: 0 %
LYMPHS: 18 %
Lymphocytes Absolute: 1.3 10*3/uL (ref 0.7–3.1)
MCH: 30.5 pg (ref 26.6–33.0)
MCHC: 35.6 g/dL (ref 31.5–35.7)
MCV: 86 fL (ref 79–97)
MONOCYTES: 11 %
Monocytes Absolute: 0.8 10*3/uL (ref 0.1–0.9)
NEUTROS PCT: 70 %
Neutrophils Absolute: 5.2 10*3/uL (ref 1.4–7.0)
PLATELETS: 252 10*3/uL (ref 150–379)
RBC: 4.72 x10E6/uL (ref 4.14–5.80)
RDW: 13.4 % (ref 12.3–15.4)
WBC: 7.3 10*3/uL (ref 3.4–10.8)

## 2017-06-06 LAB — CMP14+EGFR
A/G RATIO: 2.3 — AB (ref 1.2–2.2)
ALT: 21 IU/L (ref 0–44)
AST: 18 IU/L (ref 0–40)
Albumin: 4.8 g/dL (ref 3.5–5.5)
Alkaline Phosphatase: 55 IU/L (ref 39–117)
BUN/Creatinine Ratio: 10 (ref 9–20)
BUN: 10 mg/dL (ref 6–20)
Bilirubin Total: 0.5 mg/dL (ref 0.0–1.2)
CALCIUM: 9.6 mg/dL (ref 8.7–10.2)
CHLORIDE: 96 mmol/L (ref 96–106)
CO2: 25 mmol/L (ref 20–29)
Creatinine, Ser: 1.03 mg/dL (ref 0.76–1.27)
GFR calc Af Amer: 107 mL/min/{1.73_m2} (ref >59)
GFR, EST NON AFRICAN AMERICAN: 92 mL/min/{1.73_m2} (ref >59)
GLUCOSE: 95 mg/dL (ref 65–99)
Globulin, Total: 2.1 g/dL (ref 1.5–4.5)
POTASSIUM: 3.9 mmol/L (ref 3.5–5.2)
Sodium: 137 mmol/L (ref 134–144)
TOTAL PROTEIN: 6.9 g/dL (ref 6.0–8.5)

## 2017-06-06 LAB — LIPID PANEL
CHOLESTEROL TOTAL: 127 mg/dL (ref 100–199)
Chol/HDL Ratio: 3 ratio (ref 0.0–5.0)
HDL: 42 mg/dL (ref >39)
LDL Calculated: 63 mg/dL (ref 0–99)
TRIGLYCERIDES: 108 mg/dL (ref 0–149)
VLDL CHOLESTEROL CAL: 22 mg/dL (ref 5–40)

## 2017-06-06 LAB — VITAMIN D 25 HYDROXY (VIT D DEFICIENCY, FRACTURES): Vit D, 25-Hydroxy: 39.7 ng/mL (ref 30.0–100.0)

## 2017-06-06 LAB — TSH: TSH: 1.2 u[IU]/mL (ref 0.450–4.500)

## 2017-06-09 ENCOUNTER — Other Ambulatory Visit: Payer: Self-pay | Admitting: Family

## 2017-06-09 ENCOUNTER — Encounter: Payer: Self-pay | Admitting: Family

## 2017-06-09 MED ORDER — TRAZODONE HCL 50 MG PO TABS: 25.0000 mg | ORAL_TABLET | Freq: Every evening | ORAL | 3 refills | Status: DC | PRN

## 2017-07-01 ENCOUNTER — Other Ambulatory Visit: Payer: Self-pay | Admitting: Family

## 2017-07-01 DIAGNOSIS — F331 Major depressive disorder, recurrent, moderate: Secondary | ICD-10-CM

## 2017-07-01 DIAGNOSIS — F411 Generalized anxiety disorder: Secondary | ICD-10-CM

## 2017-07-23 ENCOUNTER — Ambulatory Visit: Payer: Managed Care, Other (non HMO) | Admitting: Family

## 2017-07-23 ENCOUNTER — Encounter: Payer: Self-pay | Admitting: Family

## 2017-07-23 VITALS — BP 133/81 | HR 90 | Temp 98.8°F | Ht 71.0 in | Wt 196.0 lb

## 2017-07-23 DIAGNOSIS — F331 Major depressive disorder, recurrent, moderate: Secondary | ICD-10-CM | POA: Diagnosis not present

## 2017-07-23 DIAGNOSIS — F411 Generalized anxiety disorder: Secondary | ICD-10-CM

## 2017-07-23 MED ORDER — BUPROPION HCL ER (XL) 300 MG PO TB24: 300.0000 mg | ORAL_TABLET | Freq: Every day | ORAL | 1 refills | Status: DC

## 2017-07-23 NOTE — Patient Instructions (Signed)

## 2017-07-23 NOTE — Progress Notes (Signed)
   Subjective:    Patient ID: Devon Collins, male    DOB: 1981-03-26, 36 y.o.   MRN: [id number]  PT presents to the office today to recheck GAD and Depression. Pt states his anxiety and depression improved, but seems a little more anxious and down recently. PT is currently doing online therapy and uses that 4-5 times a day.  Anxiety  Presents for follow-up visit. Symptoms include decreased concentration, depressed mood, excessive worry, irritability, nervous/anxious behavior, panic and restlessness. Patient reports no suicidal ideas. Symptoms occur occasionally. The severity of symptoms is moderate.    Depression         This is a chronic problem.  The current episode started more than 1 year ago.   The onset quality is gradual.   The problem occurs intermittently.  The problem has been waxing and waning since onset.  Associated symptoms include decreased concentration, irritable, restlessness and sad.  Associated symptoms include no helplessness, no hopelessness and no suicidal ideas.     The symptoms are aggravated by family issues.  Compliance with treatment is good.  Past medical history includes anxiety.       Review of Systems  Constitutional: Positive for irritability.  Psychiatric/Behavioral: Positive for decreased concentration and depression. Negative for suicidal ideas. The patient is nervous/anxious.   All other systems reviewed and are negative.      Objective:   Physical Exam  Constitutional: He is oriented to person, place, and time. He appears well-developed and well-nourished. He is irritable. No distress.  HENT:  Head: Normocephalic.  Right Ear: External ear normal.  Left Ear: External ear normal.  Nose: Nose normal.  Mouth/Throat: Oropharynx is clear and moist.  Eyes: Pupils are equal, round, and reactive to light. Right eye exhibits no discharge. Left eye exhibits no discharge.  Neck: Normal range of motion. Neck supple. No thyromegaly present.  Cardiovascular:  Normal rate, regular rhythm, normal heart sounds and intact distal pulses.  No murmur heard. Pulmonary/Chest: Effort normal and breath sounds normal. No respiratory distress. He has no wheezes.  Abdominal: Soft. Bowel sounds are normal. He exhibits no distension. There is no tenderness.  Musculoskeletal: Normal range of motion. He exhibits no edema or tenderness.  Neurological: He is alert and oriented to person, place, and time.  Skin: Skin is warm and dry. No rash noted. No erythema.  Psychiatric: He has a normal mood and affect. His behavior is normal. Judgment and thought content normal.  Vitals reviewed.    BP 133/81   Pulse 90   Temp 98.8 F (37.1 C) (Oral)   Ht 5\' 11"  (1.803 m)   Wt 196 lb (88.9 kg)   BMI 27.34 kg/m      Assessment & Plan:  1. Moderate episode of recurrent major depressive disorder (HCC) - buPROPion (WELLBUTRIN XL) 300 MG 24 hr tablet; Take 1 tablet (300 mg total) by mouth daily.  Dispense: 90 tablet; Refill: 1  2. GAD (generalized anxiety disorder) - buPROPion (WELLBUTRIN XL) 300 MG 24 hr tablet; Take 1 tablet (300 mg total) by mouth daily.  Dispense: 90 tablet; Refill: 1  Will increase Wellbutrin to 300 mg from 150 mg  Stress management  Continue Therapy  RTO in 6 weeks   Jannifer Rodneyhristy Corrin Hingle, FNP

## 2017-09-03 ENCOUNTER — Telehealth: Payer: Managed Care, Other (non HMO) | Admitting: Family

## 2017-09-03 DIAGNOSIS — J028 Acute pharyngitis due to other specified organisms: Secondary | ICD-10-CM

## 2017-09-03 DIAGNOSIS — B9689 Other specified bacterial agents as the cause of diseases classified elsewhere: Secondary | ICD-10-CM

## 2017-09-03 MED ORDER — AZITHROMYCIN 250 MG PO TABS: ORAL_TABLET | ORAL | 0 refills | Status: DC

## 2017-09-03 MED ORDER — BENZONATATE 100 MG PO CAPS: 100.0000 mg | ORAL_CAPSULE | Freq: Three times a day (TID) | ORAL | 0 refills | Status: DC | PRN

## 2017-09-03 NOTE — Progress Notes (Signed)

## 2017-09-15 ENCOUNTER — Other Ambulatory Visit: Payer: Self-pay | Admitting: Family

## 2017-09-15 DIAGNOSIS — F411 Generalized anxiety disorder: Secondary | ICD-10-CM

## 2017-09-15 DIAGNOSIS — F331 Major depressive disorder, recurrent, moderate: Secondary | ICD-10-CM

## 2017-11-02 ENCOUNTER — Other Ambulatory Visit: Payer: Self-pay | Admitting: Family

## 2017-11-02 DIAGNOSIS — I10 Essential (primary) hypertension: Secondary | ICD-10-CM

## 2017-11-23 ENCOUNTER — Other Ambulatory Visit: Payer: Self-pay | Admitting: Family

## 2017-11-23 DIAGNOSIS — F411 Generalized anxiety disorder: Secondary | ICD-10-CM

## 2017-11-23 DIAGNOSIS — F331 Major depressive disorder, recurrent, moderate: Secondary | ICD-10-CM

## 2017-12-09 ENCOUNTER — Other Ambulatory Visit: Payer: Self-pay | Admitting: Family

## 2017-12-09 DIAGNOSIS — F411 Generalized anxiety disorder: Secondary | ICD-10-CM

## 2017-12-09 DIAGNOSIS — F331 Major depressive disorder, recurrent, moderate: Secondary | ICD-10-CM

## 2017-12-28 ENCOUNTER — Other Ambulatory Visit: Payer: Self-pay | Admitting: Family

## 2017-12-28 DIAGNOSIS — E785 Hyperlipidemia, unspecified: Secondary | ICD-10-CM

## 2018-01-06 ENCOUNTER — Other Ambulatory Visit: Payer: Self-pay | Admitting: Family

## 2018-01-06 DIAGNOSIS — I10 Essential (primary) hypertension: Secondary | ICD-10-CM

## 2018-01-07 NOTE — Telephone Encounter (Signed)
Last seen 07/23/18  [facility]

## 2018-01-22 ENCOUNTER — Other Ambulatory Visit: Payer: Self-pay | Admitting: Family

## 2018-01-22 DIAGNOSIS — F411 Generalized anxiety disorder: Secondary | ICD-10-CM

## 2018-01-22 DIAGNOSIS — F331 Major depressive disorder, recurrent, moderate: Secondary | ICD-10-CM

## 2018-03-16 ENCOUNTER — Other Ambulatory Visit: Payer: Self-pay | Admitting: Family

## 2018-03-16 DIAGNOSIS — F411 Generalized anxiety disorder: Secondary | ICD-10-CM

## 2018-03-16 DIAGNOSIS — F331 Major depressive disorder, recurrent, moderate: Secondary | ICD-10-CM

## 2018-03-16 NOTE — Telephone Encounter (Signed)
Last seen 07/23/17

## 2018-03-21 ENCOUNTER — Other Ambulatory Visit: Payer: Self-pay | Admitting: Family

## 2018-03-21 DIAGNOSIS — I10 Essential (primary) hypertension: Secondary | ICD-10-CM

## 2018-04-05 ENCOUNTER — Other Ambulatory Visit: Payer: Self-pay

## 2018-04-05 DIAGNOSIS — I10 Essential (primary) hypertension: Secondary | ICD-10-CM

## 2018-04-05 MED ORDER — LISINOPRIL-HYDROCHLOROTHIAZIDE 10-12.5 MG PO TABS: 1.0000 | ORAL_TABLET | Freq: Every day | ORAL | 0 refills | Status: DC

## 2018-04-30 ENCOUNTER — Other Ambulatory Visit: Payer: Self-pay | Admitting: Family

## 2018-04-30 DIAGNOSIS — I10 Essential (primary) hypertension: Secondary | ICD-10-CM

## 2018-04-30 NOTE — Telephone Encounter (Signed)
[facility]. NTBS. 30 day given 04/05/18. Last OV 07/23/17

## 2018-05-17 NOTE — Telephone Encounter (Signed)
Patient aware and will schedule.

## 2018-06-01 ENCOUNTER — Other Ambulatory Visit: Payer: Self-pay | Admitting: Family

## 2018-06-01 DIAGNOSIS — I10 Essential (primary) hypertension: Secondary | ICD-10-CM

## 2018-06-01 NOTE — Telephone Encounter (Signed)
Last seen 07/23/17

## 2018-06-15 ENCOUNTER — Other Ambulatory Visit: Payer: Self-pay | Admitting: Family

## 2018-06-15 DIAGNOSIS — F411 Generalized anxiety disorder: Secondary | ICD-10-CM

## 2018-06-15 DIAGNOSIS — F331 Major depressive disorder, recurrent, moderate: Secondary | ICD-10-CM

## 2018-06-15 NOTE — Telephone Encounter (Signed)
Last seen 07/23/17

## 2018-06-26 ENCOUNTER — Other Ambulatory Visit: Payer: Self-pay | Admitting: Family

## 2018-06-26 DIAGNOSIS — I10 Essential (primary) hypertension: Secondary | ICD-10-CM

## 2018-07-26 ENCOUNTER — Other Ambulatory Visit: Payer: Self-pay | Admitting: Family

## 2018-07-26 DIAGNOSIS — F331 Major depressive disorder, recurrent, moderate: Secondary | ICD-10-CM

## 2018-07-26 DIAGNOSIS — F411 Generalized anxiety disorder: Secondary | ICD-10-CM

## 2018-07-26 NOTE — Telephone Encounter (Signed)
Last seen 07/23/17  Devon Collins  Needs to be seen 

## 2018-07-26 NOTE — Telephone Encounter (Signed)
Left detailed message advising ntbs for refills and to call back to schedule appt.

## 2018-08-04 ENCOUNTER — Other Ambulatory Visit: Payer: Self-pay | Admitting: Family

## 2018-08-04 DIAGNOSIS — I10 Essential (primary) hypertension: Secondary | ICD-10-CM

## 2018-08-04 NOTE — Telephone Encounter (Signed)
Last seen 07/23/17  Devon Collins  Needs to be seen

## 2018-08-04 NOTE — Telephone Encounter (Signed)
lmtcb to schedule appt 

## 2018-08-17 ENCOUNTER — Ambulatory Visit: Payer: Managed Care, Other (non HMO) | Admitting: Medical

## 2018-08-20 ENCOUNTER — Encounter: Payer: Self-pay | Admitting: Medical

## 2018-08-20 ENCOUNTER — Ambulatory Visit: Payer: Managed Care, Other (non HMO) | Admitting: Medical

## 2018-08-20 VITALS — BP 129/86 | HR 68 | Temp 97.7°F | Resp 16 | Ht 71.0 in | Wt 213.8 lb

## 2018-08-20 DIAGNOSIS — E785 Hyperlipidemia, unspecified: Secondary | ICD-10-CM | POA: Diagnosis not present

## 2018-08-20 DIAGNOSIS — F411 Generalized anxiety disorder: Secondary | ICD-10-CM | POA: Diagnosis not present

## 2018-08-20 DIAGNOSIS — F331 Major depressive disorder, recurrent, moderate: Secondary | ICD-10-CM

## 2018-08-20 DIAGNOSIS — I10 Essential (primary) hypertension: Secondary | ICD-10-CM | POA: Diagnosis not present

## 2018-08-20 MED ORDER — LISINOPRIL-HYDROCHLOROTHIAZIDE 10-12.5 MG PO TABS: 1.0000 | ORAL_TABLET | Freq: Every day | ORAL | 3 refills | Status: DC

## 2018-08-20 MED ORDER — BUPROPION HCL ER (XL) 300 MG PO TB24: 300.0000 mg | ORAL_TABLET | Freq: Every day | ORAL | 1 refills | Status: DC

## 2018-08-20 NOTE — Patient Instructions (Addendum)
Nice to meet you.  Your mood appears that the moderately controlled presently.  No severe depressive symptoms reported presently.  Though you do desire better mood.  You are already on high dose to medications.  I am going to let you review information on serotonin syndrome.  I may add low-dose medication in the near future to your current regimen.  Need to have some time to consider what might add in light of you being a new patient.   Current BP is well controlled.  Continue current medication.  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   Follow-up date to be determined after decision made on potentially adding a new medication to your depression medication regimen.   Serotonin Syndrome Serotonin is a chemical in your body (neurotransmitter) that helps to control several functions, such as:  Brain and nerve cell function.  Mood and emotions.  Memory.  Eating.  Sleeping.  Sexual activity.  Stress response. Having too much serotonin in your body can cause serotonin syndrome. This condition can be harmful to your brain and nerve cells. This can be a life-threatening condition. What are the causes? This condition may be caused by taking medicines or drugs that increase the level of serotonin in your body, such as:  Antidepressant medicines.  Migraine medicines.  Certain pain medicines.  Certain drugs, including ecstasy, LSD, cocaine, and amphetamines.  Over-the-counter cough or cold medicines that contain dextromethorphan.  Certain herbal supplements, including St. John's wort, ginseng, and nutmeg. This condition usually occurs when you take these medicines or drugs in combination, but it can also happen with a high dose of a single medicine or drug. What increases the risk? You are more likely to develop this condition if:  You just started taking a medicine or drug  that increases the level of serotonin in the body.  You recently increased the dose of a medicine or drug that increases the level of serotonin in the body.  You take more than one medicine or drug that increases the level of serotonin in the body. What are the signs or symptoms? Symptoms of this condition usually start within several hours of taking a medicine or drug. Symptoms may be mild or severe. Mild symptoms include:  Sweating.  Restlessness or agitation.  Muscle twitching or stiffness.  Rapid heart rate.  Nausea and vomiting.  Diarrhea.  Headache.  Shivering or goose bumps.  Confusion. Severe symptoms include:  Irregular heartbeat.  Seizures.  Loss of consciousness.  High fever. How is this diagnosed? This condition may be diagnosed based on:  Your medical history.  A physical exam.  Your prior use of drugs and medicines.  Blood or urine tests. These may be used to rule out other causes of your symptoms. How is this treated? The treatment for this condition depends on the severity of your symptoms.  For mild cases, stopping the medicine or drug that caused your condition is usually all that is needed.  For moderate to severe cases, treatment in a hospital may be needed to prevent or manage life-threatening symptoms. This may include medicines to control your symptoms, IV fluids, interventions to support your breathing, and treatments to control your body temperature. Follow these instructions at home: Medicines   Take over-the-counter and prescription medicines only as told by your health care provider. This is important.  Check with your health care provider before you  start taking any new prescriptions, over-the-counter medicines, herbs, or supplements.  Avoid combining any medicines that can cause this condition to occur. Lifestyle   Maintain a healthy lifestyle. ? Eat a healthy diet that includes plenty of vegetables, fruits, whole grains,  low-fat dairy products, and lean protein. Do not eat a lot of foods that are high in fat, added sugars, or salt. ? Get the right amount and quality of sleep. Most adults need 7-9 hours of sleep each night. ? Make time to exercise, even if it is only for short periods of time. Most adults should exercise for at least 150 minutes each week. ? Do not drink alcohol. ? Do not use illegal drugs, and do not take medicines for reasons other than they are prescribed. General instructions  Do not use any products that contain nicotine or tobacco, such as cigarettes and e-cigarettes. If you need help quitting, ask your health care provider.  Keep all follow-up visits as told by your health care provider. This is important. Contact a health care provider if:  Your symptoms do not improve or they get worse. Get help right away if you:  Have worsening confusion, severe headache, chest pain, high fever, seizures, or loss of consciousness.  Experience serious side effects of medicine, such as swelling of your face, lips, tongue, or throat.  Have serious thoughts about hurting yourself or others. These symptoms may represent a serious problem that is an emergency. Do not wait to see if the symptoms will go away. Get medical help right away. Call your local emergency services (911 in the [place] Do not drive yourself to the hospital. If you ever feel like you may hurt yourself or others, or have thoughts about taking your own life, get help right away. You can go to your nearest emergency department or call:  Your local emergency services (911 in the [place]  A suicide crisis helpline, such as the National Suicide Prevention Lifeline at (323)300-0364. This is open 24 hours a day. Summary  Serotonin is a brain chemical that helps to regulate the nervous system. High levels of serotonin in the body can cause serotonin syndrome, which is a very dangerous condition.  This condition may be caused by taking  medicines or drugs that increase the level of serotonin in your body.  Treatment depends on the severity of your symptoms. For mild cases, stopping the medicine or drug that caused your condition is usually all that is needed.  Check with your health care provider before you start taking any new prescriptions, over-the-counter medicines, herbs, or supplements. This information is not intended to replace advice given to you by your health care provider. Make sure you discuss any questions you have with your health care provider. Document Released: 09/11/2004 Document Revised: 09/11/2017 Document Reviewed: 09/11/2017 Elsevier Interactive Patient Education  2019 [employer].

## 2018-08-20 NOTE — Progress Notes (Addendum)
Subjective:    Patient ID: Devon Collins, male    DOB: May 04, 1981, 38 y.o.   MRN: [id number]  HPI  Pt in for first time.  Pt was formerly with pcp in [place]. He is transferring care.  Pt has htn. Controlled today.  High cholesterol and controlled last year. At work they did lipid panel and he states that was controled.  Pt has history of depression and some hx of ptsd. He states controlled with meds. He is on Wellbutrin and prestiq.  He has some intermittent episodes of mild depression. He wants mood to be better.  Hx of alcoholism. Has not drank any alcohol for 5 years.    He works for [employer]. Pt works out 4-5 days a week. 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 He eats healthy. No caffeine. Single.wife left him 16 months ago.     Review of Systems  Constitutional: Negative for chills, fatigue and fever.  HENT: Negative for congestion, drooling and ear pain.   Respiratory: Negative for cough, chest tightness, shortness of breath and wheezing.   Cardiovascular: Negative for chest pain and palpitations.  Gastrointestinal: Negative for abdominal pain.  Musculoskeletal: Negative for arthralgias, back pain, myalgias and neck stiffness.  Skin: Negative for rash.  Neurological: Negative for dizziness, seizures, weakness and headaches.  Hematological: Negative for adenopathy. Does not bruise/bleed easily.  Psychiatric/Behavioral: Positive for dysphoric mood. Negative for agitation, behavioral problems, sleep disturbance and suicidal ideas.    Past Medical History:  Diagnosis Date  . Depression   . Hyperlipemia   . Hypertension      Social History   Socioeconomic History  . Marital status: Divorced    Spouse name: Not on file  . Number of children: Not on file  . Years of education: Not on file  . Highest education level: Not on file  Occupational History  . Not on file  Social Needs  . Financial resource strain: Not on file  . Food insecurity:    Worry: Not  on file    Inability: Not on file  . Transportation needs:    Medical: Not on file    Non-medical: Not on file  Tobacco Use  . Smoking status: Never Smoker  . Smokeless tobacco: Never Used  Substance and Sexual Activity  . Alcohol use: No    Alcohol/week: 0.0 standard drinks  . Drug use: No  . Sexual activity: Not on file  Lifestyle  . Physical activity:    Days per week: Not on file    Minutes per session: Not on file  . Stress: Not on file  Relationships  . Social connections:    Talks on phone: Not on file    Gets together: Not on file    Attends religious service: Not on file    Active member of club or organization: Not on file    Attends meetings of clubs or organizations: Not on file    Relationship status: Not on file  . Intimate partner violence:    Fear of current or ex partner: Not on file    Emotionally abused: Not on file    Physically abused: Not on file    Forced sexual activity: Not on file  Other Topics Concern  . Not on file  Social History Narrative  . Not on file    Past Surgical History:  Procedure Laterality Date  . PILONIDAL CYST EXCISION N/A 02/25/2016   Procedure: CYST EXCISION PILONIDAL EXTENSIVE;  Surgeon: Riley Lam  Magnus Ivan, MD;  Location: [facility] OR;  Service: General;  Laterality: N/A;  . TONSILLECTOMY      No family history on file.  Allergies  Allergen Reactions  . No Known Allergies Other (See Comments)    Current Outpatient Medications on File Prior to Visit  Medication Sig Dispense Refill  . desvenlafaxine (PRISTIQ) 100 MG 24 hr tablet TAKE 1 TABLET BY MOUTH EVERY DAY 90 tablet 0  . lisinopril-hydrochlorothiazide (PRINZIDE,ZESTORETIC) 10-12.5 MG tablet Take 1 tablet by mouth daily. (Needs to be seen before next refill) 30 tablet 0  . simvastatin (ZOCOR) 20 MG tablet TAKE 1 TABLET BY MOUTH EVERY DAY 90 tablet 2   No current facility-administered medications on file prior to visit.     BP 129/86   Pulse 68   Temp 97.7 F (36.5 C)  (Oral)   Resp 16   Ht 5\' 11"  (1.803 m)   Wt 213 lb 12.8 oz (97 kg)   SpO2 100%   BMI 29.82 kg/m      Objective:   Physical Exam   General Mental Status- Alert. General Appearance- Not in acute distress.   Skin General: Color- Normal Color. Moisture- Normal Moisture.  Neck Carotid Arteries- Normal color. Moisture- Normal Moisture. No carotid bruits. No JVD.  Chest and Lung Exam Auscultation: Breath Sounds:-Normal.  Cardiovascular Auscultation:Rythm- Regular. Murmurs & Other Heart Sounds:Auscultation of the heart reveals- No Murmurs.  Abdomen Inspection:-Inspeection Normal. Palpation/Percussion:Note:No mass. Palpation and Percussion of the abdomen reveal- Non Tender, Non Distended + BS, no rebound or guarding.   Neurologic Cranial Nerve exam:- CN III-XII intact(No nystagmus), symmetric smile. Strength:- 5/5 equal and symmetric strength both upper and lower extremities.     Assessment & Plan:  Nice to meet you.  Your mood appears that the moderately controlled presently.  No severe depressive symptoms reported presently.  Though you do desire better mood.  You are already on high dose to medications.  I am going to let you review information on serotonin syndrome.  I may add low-dose medication in the near future to your current regimen.  Need to have some time to consider what might add in light of you being a new patient.   Current BP is well controlled.  Continue current medication.  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   Follow-up date to be determined after decision made on potentially adding a new medication to your depression medication regimen.  Did review lab send to me on my chart. Labs done in July 2019. Good cbc, cmp, lipid panel, tsh, ekg and pft. These are in my chart message he sent me. Not sure how to print or get scanned to media?  Esperanza Richters, PA-C

## 2018-08-24 ENCOUNTER — Ambulatory Visit: Payer: Managed Care, Other (non HMO) | Admitting: Family

## 2018-09-08 ENCOUNTER — Encounter: Payer: Self-pay | Admitting: Medical

## 2018-09-11 ENCOUNTER — Other Ambulatory Visit: Payer: Self-pay | Admitting: Family

## 2018-09-11 DIAGNOSIS — F411 Generalized anxiety disorder: Secondary | ICD-10-CM

## 2018-09-11 DIAGNOSIS — F331 Major depressive disorder, recurrent, moderate: Secondary | ICD-10-CM

## 2018-09-12 ENCOUNTER — Other Ambulatory Visit: Payer: Self-pay | Admitting: Family

## 2018-09-12 DIAGNOSIS — E785 Hyperlipidemia, unspecified: Secondary | ICD-10-CM

## 2018-09-13 NOTE — Telephone Encounter (Signed)
LAST LIPID 06/05/17

## 2018-10-01 ENCOUNTER — Other Ambulatory Visit: Payer: Self-pay | Admitting: Family

## 2018-10-01 ENCOUNTER — Encounter: Payer: Self-pay | Admitting: Medical

## 2018-10-01 DIAGNOSIS — E785 Hyperlipidemia, unspecified: Secondary | ICD-10-CM

## 2018-10-01 MED ORDER — SIMVASTATIN 20 MG PO TABS: 20.0000 mg | ORAL_TABLET | Freq: Every day | ORAL | 2 refills | Status: DC

## 2018-12-05 ENCOUNTER — Other Ambulatory Visit: Payer: Self-pay | Admitting: Family

## 2018-12-05 DIAGNOSIS — F411 Generalized anxiety disorder: Secondary | ICD-10-CM

## 2018-12-05 DIAGNOSIS — F331 Major depressive disorder, recurrent, moderate: Secondary | ICD-10-CM

## 2019-02-14 ENCOUNTER — Other Ambulatory Visit: Payer: Self-pay | Admitting: Medical

## 2019-02-14 DIAGNOSIS — F331 Major depressive disorder, recurrent, moderate: Secondary | ICD-10-CM

## 2019-02-14 DIAGNOSIS — F411 Generalized anxiety disorder: Secondary | ICD-10-CM

## 2019-03-04 ENCOUNTER — Other Ambulatory Visit: Payer: Self-pay | Admitting: Family

## 2019-03-04 DIAGNOSIS — F411 Generalized anxiety disorder: Secondary | ICD-10-CM

## 2019-03-04 DIAGNOSIS — F331 Major depressive disorder, recurrent, moderate: Secondary | ICD-10-CM

## 2019-06-02 ENCOUNTER — Other Ambulatory Visit: Payer: Self-pay | Admitting: Family

## 2019-06-02 DIAGNOSIS — F411 Generalized anxiety disorder: Secondary | ICD-10-CM

## 2019-06-02 DIAGNOSIS — F331 Major depressive disorder, recurrent, moderate: Secondary | ICD-10-CM

## 2019-06-02 NOTE — Telephone Encounter (Signed)
[facility] NTBS Last OV 07/23/17

## 2019-06-02 NOTE — Telephone Encounter (Signed)
Message left for patient.   Please call to schedule an appointment.  We can no longer fill prescriptions due to no visits since 2018.

## 2019-06-06 ENCOUNTER — Telehealth: Payer: Self-pay | Admitting: Medical

## 2019-06-06 NOTE — Telephone Encounter (Signed)
Pt dropped off document to be filled out by provider ( 4 pages -Yellow sheet) Pt would like document to be faxed to [phone] when ready. Document put at front office tray under providers name.

## 2019-06-06 NOTE — Telephone Encounter (Signed)
Form in red folder.    Patient last seen in January  Patient is applying for position at [employer]

## 2019-06-09 NOTE — Telephone Encounter (Signed)
I reviewed this form. I have seen pt one time. I don't know him well. Hx of depression, ptsd and alcoholism. He needs to follow up with me to assess mood and we can go over. In office of video visit. Need to talk with him about forms and answers. Please get him scheduled.

## 2019-06-10 ENCOUNTER — Other Ambulatory Visit: Payer: Self-pay

## 2019-06-10 ENCOUNTER — Encounter: Payer: Self-pay | Admitting: Medical

## 2019-06-10 NOTE — Telephone Encounter (Signed)
appt scheduled for 10/26

## 2019-06-10 NOTE — Telephone Encounter (Signed)
Patient need appointment to get form filled out.  Please get scheduled.

## 2019-06-13 ENCOUNTER — Encounter: Payer: Self-pay | Admitting: Medical

## 2019-06-13 ENCOUNTER — Other Ambulatory Visit: Payer: Self-pay

## 2019-06-13 ENCOUNTER — Ambulatory Visit: Payer: Managed Care, Other (non HMO) | Admitting: Medical

## 2019-06-13 DIAGNOSIS — E785 Hyperlipidemia, unspecified: Secondary | ICD-10-CM

## 2019-06-13 DIAGNOSIS — F411 Generalized anxiety disorder: Secondary | ICD-10-CM

## 2019-06-13 DIAGNOSIS — I10 Essential (primary) hypertension: Secondary | ICD-10-CM | POA: Diagnosis not present

## 2019-06-13 DIAGNOSIS — F331 Major depressive disorder, recurrent, moderate: Secondary | ICD-10-CM | POA: Diagnosis not present

## 2019-06-13 MED ORDER — DESVENLAFAXINE SUCCINATE ER 100 MG PO TB24: 100.0000 mg | ORAL_TABLET | Freq: Every day | ORAL | 0 refills | Status: DC

## 2019-06-13 MED ORDER — BUPROPION HCL ER (XL) 300 MG PO TB24: 300.0000 mg | ORAL_TABLET | Freq: Every day | ORAL | 1 refills | Status: DC

## 2019-06-13 MED ORDER — LISINOPRIL-HYDROCHLOROTHIAZIDE 10-12.5 MG PO TABS: 1.0000 | ORAL_TABLET | Freq: Every day | ORAL | 3 refills | Status: DC

## 2019-06-13 MED ORDER — SIMVASTATIN 20 MG PO TABS: 20.0000 mg | ORAL_TABLET | Freq: Every day | ORAL | 2 refills | Status: DC
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
## 2019-06-13 NOTE — Patient Instructions (Addendum)
I filled out your paperwork today. You mood has been stable and sober for 5 years.   If mood worsens or changes notify us. Refilling your medications for mood today.  Bp well controlled and refilled meds.  Refilled zocor today for cholesterol.  Follow up in 3 months or as needed

## 2019-07-19 ENCOUNTER — Other Ambulatory Visit: Payer: Self-pay

## 2019-07-19 DIAGNOSIS — Z20822 Contact with and (suspected) exposure to covid-19: Secondary | ICD-10-CM

## 2019-07-21 LAB — NOVEL CORONAVIRUS, NAA: SARS-CoV-2, NAA: NOT DETECTED

## 2019-09-14 ENCOUNTER — Other Ambulatory Visit: Payer: Self-pay | Admitting: Medical

## 2019-09-14 DIAGNOSIS — F331 Major depressive disorder, recurrent, moderate: Secondary | ICD-10-CM

## 2019-09-14 DIAGNOSIS — F411 Generalized anxiety disorder: Secondary | ICD-10-CM

## 2019-10-02 ENCOUNTER — Other Ambulatory Visit: Payer: Self-pay | Admitting: Medical

## 2019-10-02 DIAGNOSIS — F331 Major depressive disorder, recurrent, moderate: Secondary | ICD-10-CM

## 2019-10-02 DIAGNOSIS — F411 Generalized anxiety disorder: Secondary | ICD-10-CM

## 2019-10-07 ENCOUNTER — Encounter: Payer: Self-pay | Admitting: Medical

## 2019-10-08 ENCOUNTER — Telehealth: Payer: Self-pay | Admitting: Medical

## 2019-10-08 DIAGNOSIS — F411 Generalized anxiety disorder: Secondary | ICD-10-CM

## 2019-10-08 DIAGNOSIS — F331 Major depressive disorder, recurrent, moderate: Secondary | ICD-10-CM

## 2019-10-08 MED ORDER — BUPROPION HCL ER (XL) 300 MG PO TB24: 300.0000 mg | ORAL_TABLET | Freq: Every day | ORAL | 0 refills | Status: DC

## 2019-10-08 NOTE — Telephone Encounter (Signed)
Rx wellbutrin sent to pt pharmacy. °

## 2020-02-19 ENCOUNTER — Other Ambulatory Visit: Payer: Self-pay | Admitting: Medical

## 2020-02-19 DIAGNOSIS — F331 Major depressive disorder, recurrent, moderate: Secondary | ICD-10-CM

## 2020-02-19 DIAGNOSIS — F411 Generalized anxiety disorder: Secondary | ICD-10-CM

## 2020-03-19 ENCOUNTER — Other Ambulatory Visit: Payer: Self-pay | Admitting: Medical

## 2020-03-19 DIAGNOSIS — E785 Hyperlipidemia, unspecified: Secondary | ICD-10-CM

## 2020-03-22 ENCOUNTER — Ambulatory Visit: Payer: Managed Care, Other (non HMO) | Admitting: Medical

## 2020-03-22 ENCOUNTER — Other Ambulatory Visit: Payer: Self-pay | Admitting: Medical

## 2020-03-22 DIAGNOSIS — F411 Generalized anxiety disorder: Secondary | ICD-10-CM

## 2020-03-22 DIAGNOSIS — F331 Major depressive disorder, recurrent, moderate: Secondary | ICD-10-CM

## 2020-04-14 ENCOUNTER — Other Ambulatory Visit: Payer: Self-pay | Admitting: Medical

## 2020-04-14 DIAGNOSIS — E785 Hyperlipidemia, unspecified: Secondary | ICD-10-CM

## 2020-05-11 ENCOUNTER — Other Ambulatory Visit: Payer: Self-pay | Admitting: Medical

## 2020-05-11 DIAGNOSIS — E785 Hyperlipidemia, unspecified: Secondary | ICD-10-CM

## 2020-06-07 ENCOUNTER — Other Ambulatory Visit: Payer: Self-pay | Admitting: Medical

## 2020-06-07 DIAGNOSIS — E785 Hyperlipidemia, unspecified: Secondary | ICD-10-CM

## 2020-06-21 ENCOUNTER — Other Ambulatory Visit: Payer: Self-pay | Admitting: Medical

## 2020-06-21 DIAGNOSIS — E785 Hyperlipidemia, unspecified: Secondary | ICD-10-CM

## 2020-06-27 ENCOUNTER — Other Ambulatory Visit: Payer: Self-pay | Admitting: Medical

## 2020-06-27 DIAGNOSIS — F411 Generalized anxiety disorder: Secondary | ICD-10-CM

## 2020-06-27 DIAGNOSIS — F331 Major depressive disorder, recurrent, moderate: Secondary | ICD-10-CM

## 2020-08-26 ENCOUNTER — Other Ambulatory Visit: Payer: Self-pay | Admitting: Medical

## 2020-08-26 DIAGNOSIS — I10 Essential (primary) hypertension: Secondary | ICD-10-CM

## 2020-09-13 ENCOUNTER — Ambulatory Visit: Payer: Managed Care, Other (non HMO) | Admitting: Medical

## 2020-09-14 ENCOUNTER — Other Ambulatory Visit: Payer: Self-pay

## 2020-09-14 ENCOUNTER — Ambulatory Visit: Payer: Managed Care, Other (non HMO) | Admitting: Medical

## 2020-09-14 VITALS — BP 122/77 | HR 82 | Temp 98.2°F | Resp 18 | Ht 70.0 in | Wt 220.0 lb

## 2020-09-14 DIAGNOSIS — F331 Major depressive disorder, recurrent, moderate: Secondary | ICD-10-CM | POA: Diagnosis not present

## 2020-09-14 DIAGNOSIS — F411 Generalized anxiety disorder: Secondary | ICD-10-CM | POA: Diagnosis not present

## 2020-09-14 DIAGNOSIS — I10 Essential (primary) hypertension: Secondary | ICD-10-CM

## 2020-09-14 DIAGNOSIS — E785 Hyperlipidemia, unspecified: Secondary | ICD-10-CM | POA: Diagnosis not present

## 2020-09-14 MED ORDER — SIMVASTATIN 20 MG PO TABS: 20.0000 mg | ORAL_TABLET | Freq: Every day | ORAL | 3 refills | Status: DC

## 2020-09-14 MED ORDER — LISINOPRIL-HYDROCHLOROTHIAZIDE 10-12.5 MG PO TABS: 1.0000 | ORAL_TABLET | Freq: Every day | ORAL | 3 refills | Status: DC

## 2020-09-14 MED ORDER — BUPROPION HCL ER (XL) 300 MG PO TB24: 300.0000 mg | ORAL_TABLET | Freq: Every day | ORAL | 1 refills | Status: DC

## 2020-09-14 MED ORDER — DESVENLAFAXINE SUCCINATE ER 100 MG PO TB24: 100.0000 mg | ORAL_TABLET | Freq: Every day | ORAL | 1 refills | Status: DC

## 2020-09-14 NOTE — Patient Instructions (Addendum)
Bp well controlled. Continue zestoretic 10-12.5 mg daily. Please get me copies of recent labs done at work.  For high cholesterol controlled per last labs. Continue zestoretic daily.  Depression very well  controlled with wellbutrin and pristiq.  Follow up 6 months or as needed

## 2020-09-14 NOTE — Progress Notes (Signed)
Subjective:    Patient ID: Devon Collins, male    DOB: [birth year], 40 y.o.   MRN: [id number]  HPI  Pt in for med refill.   Pt gets physical thru his work. He states cholesterol was better than in the past. He states metabolic panel done as well. Asking pt to bring by copy so we can scan.   Pt bp is well controlled. He is on zestoretic 10-12.5 mg daily.   Pt has history of depression and some hx of ptsd. He states controlled with meds. He is on Wellbutrin and prestiq.  He has some intermittent episodes of mild depression. Since last visit mood is very well controlled per pt report. PHQ-9 done and 0 score today. Asked MA to abstract and scan.  Hx of alcoholism. Has not drank any alcohol for 5 years.  Hx of high cholesterol. On simvastatin 20 mg daily.   Review of Systems  Constitutional: Negative for chills, fatigue and fever.  Respiratory: Negative for cough, chest tightness, shortness of breath and wheezing.   Cardiovascular: Negative for chest pain and palpitations.  Gastrointestinal: Negative for abdominal pain, blood in stool and vomiting.  Genitourinary: Negative for enuresis.  Musculoskeletal: Negative for back pain and gait problem.  Skin: Negative for rash.  Neurological: Negative for dizziness, speech difficulty, weakness and headaches.  Hematological: Negative for adenopathy. Does not bruise/bleed easily.  Psychiatric/Behavioral: Negative for behavioral problems and confusion.    Past Medical History:  Diagnosis Date  . Depression   . Hyperlipemia   . Hypertension      Social History   Socioeconomic History  . Marital status: Divorced    Spouse name: Not on file  . Number of children: Not on file  . Years of education: Not on file  . Highest education level: Not on file  Occupational History  . Not on file  Tobacco Use  . Smoking status: Never Smoker  . Smokeless tobacco: Never Used  Vaping Use  . Vaping Use: Never used  Substance and Sexual  Activity  . Alcohol use: No    Alcohol/week: 0.0 standard drinks  . Drug use: No  . Sexual activity: Not on file  Other Topics Concern  . Not on file  Social History Narrative  . Not on file   Social Determinants of Health   Financial Resource Strain: Not on file  Food Insecurity: Not on file  Transportation Needs: Not on file  Physical Activity: Not on file  Stress: Not on file  Social Connections: Not on file  Intimate Partner Violence: Not on file    Past Surgical History:  Procedure Laterality Date  . PILONIDAL CYST EXCISION N/A 02/25/2016   Procedure: CYST EXCISION PILONIDAL EXTENSIVE;  Surgeon: Abigail Miyamoto, MD;  Location: [facility] OR;  Service: General;  Laterality: N/A;  . TONSILLECTOMY      No family history on file.  Allergies  Allergen Reactions  . No Known Allergies Other (See Comments)    Current Outpatient Medications on File Prior to Visit  Medication Sig Dispense Refill  . buPROPion (WELLBUTRIN XL) 300 MG 24 hr tablet TAKE 1 TABLET BY MOUTH EVERY DAY 90 tablet 1  . desvenlafaxine (PRISTIQ) 100 MG 24 hr tablet TAKE 1 TABLET BY MOUTH EVERY DAY 90 tablet 1  . lisinopril-hydrochlorothiazide (ZESTORETIC) 10-12.5 MG tablet Take 1 tablet by mouth daily. 90 tablet 3  . simvastatin (ZOCOR) 20 MG tablet Take 1 tablet (20 mg total) by mouth daily. 30 tablet 0  No current facility-administered medications on file prior to visit.    BP 122/77   Pulse 82   Temp 98.2 F (36.8 C) (Oral)   Resp 18   Ht 5\' 10"  (1.778 m)   Wt 220 lb (99.8 kg)   SpO2 100%   BMI 31.57 kg/m       Objective:   Physical Exam   General Mental Status- Alert. General Appearance- Not in acute distress.   Skin General: Color- Normal Color. Moisture- Normal Moisture.  Neck Carotid Arteries- Normal color. Moisture- Normal Moisture. No carotid bruits. No JVD.  Chest and Lung Exam Auscultation: Breath Sounds:-Normal.  Cardiovascular Auscultation:Rythm- Regular. Murmurs &  Other Heart Sounds:Auscultation of the heart reveals- No Murmurs.  Abdomen Inspection:-Inspeection Normal. Palpation/Percussion:Note:No mass. Palpation and Percussion of the abdomen reveal- Non Tender, Non Distended + BS, no rebound or guarding.    Neurologic Cranial Nerve exam:- CN III-XII intact(No nystagmus), symmetric smile. Strength:- 5/5 equal and symmetric strength both upper and lower extremities.     Assessment & Plan:  Bp well controlled. Continue zestoretic 10-12.5 mg daily. Please get me copies of recent labs done at work.  For high cholesterol controlled per last labs. Continue zestoretic daily.  Depression controlled with wellbutrin and pristiq.  Follow up 6 months or as needed

## 2021-03-14 ENCOUNTER — Ambulatory Visit: Payer: Managed Care, Other (non HMO) | Admitting: Medical

## 2021-06-24 ENCOUNTER — Other Ambulatory Visit: Payer: Self-pay | Admitting: Medical

## 2021-06-24 DIAGNOSIS — E785 Hyperlipidemia, unspecified: Secondary | ICD-10-CM

## 2021-06-24 DIAGNOSIS — F411 Generalized anxiety disorder: Secondary | ICD-10-CM

## 2021-06-24 DIAGNOSIS — F331 Major depressive disorder, recurrent, moderate: Secondary | ICD-10-CM

## 2021-10-07 ENCOUNTER — Other Ambulatory Visit: Payer: Self-pay | Admitting: Medical

## 2021-10-07 DIAGNOSIS — I10 Essential (primary) hypertension: Secondary | ICD-10-CM

## 2021-12-21 ENCOUNTER — Other Ambulatory Visit: Payer: Self-pay | Admitting: Medical

## 2021-12-21 DIAGNOSIS — F411 Generalized anxiety disorder: Secondary | ICD-10-CM

## 2021-12-21 DIAGNOSIS — F331 Major depressive disorder, recurrent, moderate: Secondary | ICD-10-CM

## 2022-04-19 ENCOUNTER — Other Ambulatory Visit: Payer: Self-pay | Admitting: Medical

## 2022-04-19 DIAGNOSIS — F331 Major depressive disorder, recurrent, moderate: Secondary | ICD-10-CM

## 2022-04-19 DIAGNOSIS — F411 Generalized anxiety disorder: Secondary | ICD-10-CM

## 2022-06-17 ENCOUNTER — Other Ambulatory Visit: Payer: Self-pay | Admitting: Medical

## 2022-06-17 DIAGNOSIS — F411 Generalized anxiety disorder: Secondary | ICD-10-CM

## 2022-06-17 DIAGNOSIS — F331 Major depressive disorder, recurrent, moderate: Secondary | ICD-10-CM

## 2022-12-18 ENCOUNTER — Other Ambulatory Visit: Payer: Self-pay | Admitting: Medical

## 2022-12-18 DIAGNOSIS — F411 Generalized anxiety disorder: Secondary | ICD-10-CM

## 2022-12-18 DIAGNOSIS — F331 Major depressive disorder, recurrent, moderate: Secondary | ICD-10-CM

## 2023-06-24 ENCOUNTER — Ambulatory Visit (HOSPITAL_BASED_OUTPATIENT_CLINIC_OR_DEPARTMENT_OTHER)
Admission: RE | Admit: 2023-06-24 | Discharge: 2023-06-24 | Disposition: A | Discharge status: Home / Self Care | Payer: Managed Care, Other (non HMO) | Source: Ambulatory Visit | Attending: Family Medicine | Admitting: Family Medicine

## 2023-06-24 ENCOUNTER — Encounter: Payer: Self-pay | Admitting: Family Medicine

## 2023-06-24 ENCOUNTER — Ambulatory Visit: Payer: Managed Care, Other (non HMO) | Admitting: Family Medicine

## 2023-06-24 VITALS — BP 130/86 | HR 68 | Ht 70.0 in | Wt 216.0 lb

## 2023-06-24 DIAGNOSIS — M25562 Pain in left knee: Secondary | ICD-10-CM | POA: Insufficient documentation

## 2023-06-24 MED ORDER — MELOXICAM 15 MG PO TABS: 15.0000 mg | ORAL_TABLET | Freq: Every day | ORAL | 0 refills | Status: AC

## 2023-06-24 NOTE — Progress Notes (Signed)
Acute Office Visit  Subjective:     Patient ID: Devon Collins, male    DOB: 1980-09-17, 42 y.o.   MRN: [id number]  Chief Complaint  Patient presents with   Knee Pain          Patient is in today for knee pain.  Discussed the use of AI scribe software for clinical note transcription with the patient, who gave verbal consent to proceed.  History of Present Illness   The patient, an active individual with no significant past medical history, presents with a six-week history of knee discomfort. The onset of the discomfort was insidious, with no identifiable inciting event or injury. The patient reports no associated swelling or bruising. Despite attempts at rest and reduction in his usual running regimen, the discomfort has persisted and not improved. The patient has tried occasional ibuprofen with uncertain benefit.  The discomfort is exacerbated by certain movements, particularly bending and weight-bearing activities such as climbing stairs or performing squats. The patient reports a sensation of tightness and potential weakness/instability in the knee, although there is no history of the knee giving out. The discomfort is localized to the medial aspect of the knee.  The patient denies any abnormal popping, clicking, or locking of the knee. There is no prior history of injury to the same knee. The patient has been managing the discomfort with rest, reduction in physical activity, and occasional use of ibuprofen.           ROS All review of systems negative except what is listed in the HPI      Objective:    BP 130/86   Pulse 68   Ht 5\' 10"  (1.778 m)   Wt 216 lb (98 kg)   SpO2 100%   BMI 30.99 kg/m    Physical Exam Vitals reviewed.  Constitutional:      Appearance: Normal appearance.  Musculoskeletal:        General: No swelling.     Comments: Left knee pain to medial joint line palpation and flexion; negative anterior/posterior drawer, valgus/varus  Skin:     General: Skin is warm and dry.     Findings: No bruising.  Neurological:     Mental Status: He is alert and oriented to person, place, and time.  Psychiatric:        Mood and Affect: Mood normal.        Behavior: Behavior normal.        Thought Content: Thought content normal.        Judgment: Judgment normal.     No results found for any visits on 06/24/23.      Assessment & Plan:   Problem List Items Addressed This Visit   None Visit Diagnoses     Acute pain of left knee    -  Primary   Relevant Medications   meloxicam (MOBIC) 15 MG tablet   Other Relevant Orders   Ambulatory referral to Sports Medicine   DG Knee 1-2 Views Left      Pain in the medial knee for approximately six weeks, exacerbated by flexion, weight-bearing, and stairs. No history of trauma, no noticeable swelling or bruising.  -Order knee x-ray. -Refer to sports medicine for further evaluation and management. -Prescribe Meloxicam for pain and inflammation, advise against combining with other NSAIDs. -Advise rest, ice, heat, and gentle stretching. -Recommend use of a knee sleeve or simple brace for support if needed. -Advise to avoid running and squats until further evaluation. -Follow-up  as needed, especially if symptoms worsen before sports medicine appointment.        Meds ordered this encounter  Medications   meloxicam (MOBIC) 15 MG tablet    Sig: Take 1 tablet (15 mg total) by mouth daily.    Dispense:  30 tablet    Refill:  0    Order Specific Question:   Supervising Provider    Answer:   Danise Edge A [[id number]]    Return if symptoms worsen or fail to improve.  Clayborne Dana, NP

## 2023-06-25 ENCOUNTER — Ambulatory Visit: Payer: Managed Care, Other (non HMO) | Admitting: Sports Medicine

## 2023-06-25 VITALS — BP 140/86 | HR 86 | Ht 70.0 in | Wt 214.0 lb

## 2023-06-25 DIAGNOSIS — M25562 Pain in left knee: Secondary | ICD-10-CM | POA: Diagnosis not present

## 2023-06-25 NOTE — Patient Instructions (Signed)
-   Start meloxicam 15 mg daily x2 weeks.  If still having pain after 2 weeks, complete 3rd-week of meloxicam. May use remaining meloxicam as needed once daily for pain control.  Do not to use additional NSAIDs while taking meloxicam.  May use Tylenol [phone] mg 2 to 3 times a day for breakthrough pain. Knee HEP 4 week follow up

## 2023-06-25 NOTE — Progress Notes (Signed)
Devon Collins D.[facility] [address] [place] [address] Phone: [phone]   Assessment and Plan:     1. Acute pain of left knee - Acute, initial sports medicine visit -Most consistent with either flare of mild medial compartment osteoarthritis versus pes anserine bursitis with tendinopathy of SGT tendons based on HPI and physical exam and x-ray imaging - Patient had x-ray yesterday.  Reviewed x-ray with patient at today's visit.  My interpretation: No acute fracture or dislocation.  Mildly decreased medial compartment joint space without cortical abnormality - Start meloxicam 15 mg daily x2 weeks.  If still having pain after 2 weeks, complete 3rd-week of meloxicam. May use remaining meloxicam as needed once daily for pain control.  Do not to use additional NSAIDs while taking meloxicam.  May use Tylenol [phone] mg 2 to 3 times a day for breakthrough pain.  Patient was previously prescribed medication, though has not yet picked it up from pharmacy - Start HEP for knee and pes anserine bursitis - Recommend 1 week of relative rest and May continue physical activity including running as tolerated.  15 additional minutes spent for educating Therapeutic Home Exercise Program.  This included exercises focusing on stretching, strengthening, with focus on eccentric aspects.   Long term goals include an improvement in range of motion, strength, endurance as well as avoiding reinjury. Patient's frequency would include in 1-2 times a day, 3-5 times a week for a duration of 6-12 weeks. Proper technique shown and discussed handout in great detail with ATC.  All questions were discussed and answered.     Pertinent previous records reviewed include right knee x-ray 06/24/2023, family medicine note 06/24/2023  Follow Up: 4 weeks for reevaluation.  If no improvement or worsening of symptoms, could consider CSI versus physical therapy   Subjective:   I, Devon Collins, am serving as a Neurosurgeon for Devon Collins  Chief Complaint: left knee pain  HPI:   06/25/23 Patient is a 42 year old male with concerns of left knee pain. Patient states that he has had pain for 6 weeks. No MOI, he does run. No meds for the pain. No numbness or tingling. Does endorse throbbing. Medial knee pain. Antalgic gait , pain with stairs, standing.   Relevant Historical Information: Hypertension  Additional pertinent review of systems negative.   Current Outpatient Medications:    buPROPion (WELLBUTRIN XL) 300 MG 24 hr tablet, TAKE 1 TABLET BY MOUTH EVERY DAY, Disp: 90 tablet, Rfl: 1   desvenlafaxine (PRISTIQ) 100 MG 24 hr tablet, TAKE 1 TABLET BY MOUTH EVERY DAY, Disp: 90 tablet, Rfl: 1   lisinopril-hydrochlorothiazide (ZESTORETIC) 10-12.5 MG tablet, TAKE 1 TABLET BY MOUTH EVERY DAY, Disp: 90 tablet, Rfl: 3   meloxicam (MOBIC) 15 MG tablet, Take 1 tablet (15 mg total) by mouth daily., Disp: 30 tablet, Rfl: 0   simvastatin (ZOCOR) 20 MG tablet, TAKE 1 TABLET BY MOUTH EVERY DAY, Disp: 90 tablet, Rfl: 3   Objective:     Vitals:   06/25/23 1458  BP: (!) 140/86  Pulse: 86  SpO2: 98%  Weight: 214 lb (97.1 kg)  Height: 5\' 10"  (1.778 m)      Body mass index is 30.71 kg/m.    Physical Exam:    General:  awake, alert oriented, no acute distress nontoxic Skin: no suspicious lesions or rashes Neuro:sensation intact and strength 5/5 with no deficits, no atrophy, normal muscle tone Psych: No signs of anxiety, depression or  other mood disorder  Left knee: No swelling No deformity Neg fluid wave, joint milking ROM Flex 110, Ext 0 TTP medial joint line, pes anserine bursa NTTP over the quad tendon, medial fem condyle, lat fem condyle, patella, plica, patella tendon, tibial tuberostiy, fibular head, posterior fossa,  gerdy's tubercle,  lateral jt line Neg anterior and posterior drawer Neg lachman Neg sag sign Negative varus stress Negative valgus stress for  laxity, but reproduced medial knee pain Negative McMurray for palpable pop, however valgus stress reproduced medial knee pain   Gait normal    Electronically signed by:  Devon Collins D.[facility] 3:17 PM 06/25/23

## 2023-07-27 ENCOUNTER — Ambulatory Visit: Payer: Managed Care, Other (non HMO) | Admitting: Sports Medicine
